# Patient Record
Sex: Male | Born: 1955 | Race: White | Hispanic: No | Marital: Married | Smoking: Former smoker
Health system: Southern US, Community
[De-identification: ages and names within clinical notes are randomized; demographics above are authoritative.]

## PROBLEM LIST (undated history)

## (undated) DIAGNOSIS — D369 Benign neoplasm, unspecified site: Secondary | ICD-10-CM

## (undated) DIAGNOSIS — J189 Pneumonia, unspecified organism: Secondary | ICD-10-CM

## (undated) DIAGNOSIS — T884XXA Failed or difficult intubation, initial encounter: Secondary | ICD-10-CM

## (undated) DIAGNOSIS — J449 Chronic obstructive pulmonary disease, unspecified: Secondary | ICD-10-CM

## (undated) HISTORY — PX: APPENDECTOMY: SHX54

## (undated) HISTORY — PX: VASECTOMY: SHX75

## (undated) HISTORY — DX: Benign neoplasm, unspecified site: D36.9

## (undated) HISTORY — PX: POLYPECTOMY: SHX149

## (undated) HISTORY — PX: TONSILLECTOMY: SUR1361

---

## 2008-11-27 HISTORY — PX: COLONOSCOPY: SHX174

## 2015-01-13 ENCOUNTER — Encounter: Payer: Self-pay | Admitting: Internal Medicine

## 2015-01-13 ENCOUNTER — Telehealth: Payer: Self-pay | Admitting: Internal Medicine

## 2015-03-19 ENCOUNTER — Ambulatory Visit (AMBULATORY_SURGERY_CENTER): Payer: Self-pay | Admitting: *Deleted

## 2015-03-19 VITALS — Ht 68.0 in | Wt 217.0 lb

## 2015-03-19 DIAGNOSIS — Z86010 Personal history of colonic polyps: Secondary | ICD-10-CM

## 2015-03-19 MED ORDER — MOVIPREP 100 G PO SOLR: ORAL | Status: DC

## 2015-03-19 NOTE — Progress Notes (Signed)
Patient denies any allergies to eggs or soy. Patient denies any problems with anesthesia/sedation. Per last colon report, patient was unable to sedate with moderate sedation, patient states he was told he was combative, so propofol used and patient states he did fine with that.  Patient denies any oxygen use at home and does not take any diet/weight loss medications. EMMI education assisgned to patient on colonoscopy, this was explained and instructions given to patient.

## 2015-04-02 ENCOUNTER — Other Ambulatory Visit: Payer: Self-pay | Admitting: Internal Medicine

## 2015-04-02 ENCOUNTER — Ambulatory Visit (AMBULATORY_SURGERY_CENTER): Payer: Managed Care, Other (non HMO) | Admitting: Internal Medicine

## 2015-04-02 ENCOUNTER — Encounter: Payer: Self-pay | Admitting: Internal Medicine

## 2015-04-02 VITALS — BP 113/71 | HR 76 | Temp 97.5°F | Resp 16 | Ht 68.0 in | Wt 217.0 lb

## 2015-04-02 DIAGNOSIS — D122 Benign neoplasm of ascending colon: Secondary | ICD-10-CM

## 2015-04-02 DIAGNOSIS — D123 Benign neoplasm of transverse colon: Secondary | ICD-10-CM | POA: Diagnosis not present

## 2015-04-02 DIAGNOSIS — D124 Benign neoplasm of descending colon: Secondary | ICD-10-CM

## 2015-04-02 DIAGNOSIS — Z86010 Personal history of colonic polyps: Secondary | ICD-10-CM

## 2015-04-02 DIAGNOSIS — D120 Benign neoplasm of cecum: Secondary | ICD-10-CM | POA: Diagnosis not present

## 2015-04-02 DIAGNOSIS — D125 Benign neoplasm of sigmoid colon: Secondary | ICD-10-CM

## 2015-04-02 MED ORDER — SODIUM CHLORIDE 0.9 % IV SOLN: 500.0000 mL | INTRAVENOUS | Status: DC

## 2015-04-02 MED ORDER — LINACLOTIDE 145 MCG PO CAPS: 145.0000 ug | ORAL_CAPSULE | Freq: Every day | ORAL | Status: DC

## 2015-04-02 NOTE — Op Note (Signed)
[facility]  [address] [place], [address]   COLONOSCOPY PROCEDURE REPORT  PATIENT: Tim, West  MR#: [id number] BIRTHDATE: [birth year] , 76  yrs. old GENDER: male ENDOSCOPIST: Jerene Bears, MD REFERRED BY: Kennieth Rad, MD PROCEDURE DATE:  04/02/2015 PROCEDURE:   Colonoscopy, surveillance and Colonoscopy with snare polypectomy First Screening Colonoscopy - Avg.  risk and is 50 yrs.  old or older - No.  Prior Negative Screening - Now for repeat screening. N/A  History of Adenoma - Now for follow-up colonoscopy & has been > or = to 3 yrs.  Yes hx of adenoma.  Has been 3 or more years since last colonoscopy.  Polyps Removed Today ASA CLASS:   Class III INDICATIONS:Surveillance due to prior colonic neoplasia and PH Colon Adenoma (Dr. West Carbo roughly 9 yrs ago). MEDICATIONS: Propofol 550 mg IV and Monitored anesthesia care  DESCRIPTION OF PROCEDURE:   After the risks benefits and alternatives of the procedure were thoroughly explained, informed consent was obtained.  The digital rectal exam revealed no rectal mass and revealed several skin tags.   The [id number] endoscope was introduced through the anus and advanced to the cecum, which was identified by both the appendix and ileocecal valve. No adverse events experienced.   The quality of the prep was (MoviPrep was used) fair.  The instrument was then slowly withdrawn as the colon was fully examined.   COLON FINDINGS: A sessile polyp measuring 6 mm in size was found at the appendiceal orifice.  A polypectomy was performed with a cold snare.  The resection was complete, the polyp tissue was completely retrieved and sent to histology.   Two sessile polyps ranging between 3-67mm in size were found at the cecum.  Polypectomies were performed with cold forceps.  The resection was complete, the polyp tissue was completely retrieved and sent to histology.   A sessile polyp measuring 25 mm in size with a  mucous cap was found in the ascending colon.  A polypectomy was performed using snare cautery. The resection was complete, the polyp tissue was completely retrieved and sent to histology.   Six sessile polyps ranging between 3-49mm in size were found in the ascending colon and transverse colon.  Polypectomies were performed with a cold snare. The resection was complete, the polyp tissue was completely retrieved and sent to histology.   Three sessile polyps ranging between 3-84mm in size were found in the descending colon and sigmoid colon.  Polypectomies were performed with a cold snare. The resection was complete, the polyp tissue was completely retrieved and sent to histology.  Retroflexed views revealed internal hemorrhoids and Retroflexed views revealed external hemorrhoids. The time to cecum = 12.2 Withdrawal time = 28.9   The scope was withdrawn and the procedure completed. COMPLICATIONS: There were no immediate complications.  ENDOSCOPIC IMPRESSION: 1.   Sessile polyp was found at the appendiceal orifice; polypectomy was performed with a cold snare 2.   Two sessile polyps ranging between 3-31mm in size were found at the cecum; polypectomies were performed with cold forceps 3.   Sessile polyp was found in the ascending colon; polypectomy was performed using snare cautery 4.   Six sessile polyps ranging between 3-22mm in size were found in the ascending colon and transverse colon; polypectomies were performed with a cold snare 5.   Three sessile polyps ranging between 3-25mm in size were found in the descending colon and sigmoid colon; polypectomies were performed with a cold snare  RECOMMENDATIONS: 1.  Hold Aspirin and all other NSAIDS for 2 weeks. 2.  Await pathology results 3.  Repeat Colonoscopy in 1 year. 4.  You will receive a letter within 1-2 weeks with the results of your biopsy as well as final recommendations.  Please call my office if you have not received a letter  after 3 weeks.  eSigned:  Jerene Bears, MD 04/02/2015 10:43 AM   cc: The Patient, Kennieth Rad, MD   PATIENT NAME:  Tim, West MR#: [id number]

## 2015-04-02 NOTE — Patient Instructions (Signed)
NO ASPIRIN, ASPIRIN PRODUCTS OR NSAIDS (MOTRIN, ALEVE,IBUPROFEN OR ADVIL) FOR TWO WEEKS (Apr 16, 2015)    YOU HAD AN ENDOSCOPIC PROCEDURE TODAY AT [facility]:   Refer to the procedure report that was given to you for any specific questions about what was found during the examination.  If the procedure report does not answer your questions, please call your gastroenterologist to clarify.  If you requested that your care partner not be given the details of your procedure findings, then the procedure report has been included in a sealed envelope for you to review at your convenience later.  YOU SHOULD EXPECT: Some feelings of bloating in the abdomen. Passage of more gas than usual.  Walking can help get rid of the air that was put into your GI tract during the procedure and reduce the bloating. If you had a lower endoscopy (such as a colonoscopy or flexible sigmoidoscopy) you may notice spotting of blood in your stool or on the toilet paper. If you underwent a bowel prep for your procedure, you may not have a normal bowel movement for a few days.  Please Note:  You might notice some irritation and congestion in your nose or some drainage.  This is from the oxygen used during your procedure.  There is no need for concern and it should clear up in a day or so.  SYMPTOMS TO REPORT IMMEDIATELY:   Following lower endoscopy (colonoscopy or flexible sigmoidoscopy):  Excessive amounts of blood in the stool  Significant tenderness or worsening of abdominal pains  Swelling of the abdomen that is new, acute  Fever of 100F or higher   For urgent or emergent issues, a gastroenterologist can be reached at any hour by calling [phone].   DIET: Your first meal following the procedure should be a small meal and then it is ok to progress to your normal diet. Heavy or fried foods are harder to digest and may make you feel nauseous or bloated.  Likewise, meals heavy in dairy and vegetables  can increase bloating.  Drink plenty of fluids but you should avoid alcoholic beverages for 24 hours.  ACTIVITY:  You should plan to take it easy for the rest of today and you should NOT DRIVE or use heavy machinery until tomorrow (because of the sedation medicines used during the test).    FOLLOW UP: Our staff will call the number listed on your records the next business day following your procedure to check on you and address any questions or concerns that you may have regarding the information given to you following your procedure. If we do not reach you, we will leave a message.  However, if you are feeling well and you are not experiencing any problems, there is no need to return our call.  We will assume that you have returned to your regular daily activities without incident.  If any biopsies were taken you will be contacted by phone or by letter within the next 1-3 weeks.  Please call us at [phone] if you have not heard about the biopsies in 3 weeks.    SIGNATURES/CONFIDENTIALITY: You and/or your care partner have signed paperwork which will be entered into your electronic medical record.  These signatures attest to the fact that that the information above on your After Visit Summary has been reviewed and is understood.  Full responsibility of the confidentiality of this discharge information lies with you and/or your care-partner.

## 2015-04-02 NOTE — Progress Notes (Signed)
A/ox3 pleased with MAC, report to Karen RN 

## 2015-04-02 NOTE — Progress Notes (Signed)
Called to room to assist during endoscopic procedure.  Patient ID and intended procedure confirmed with present staff. Received instructions for my participation in the procedure from the performing physician.  

## 2015-04-05 ENCOUNTER — Telehealth: Payer: Self-pay | Admitting: *Deleted

## 2015-04-05 NOTE — Telephone Encounter (Signed)
  Follow up Call-  Call back number 04/02/2015  Post procedure Call Back phone  # [phone]  Permission to leave phone message Yes     Patient questions:  Do you have a fever, pain , or abdominal swelling? No. Pain Score  0 *  Have you tolerated food without any problems? Yes.    Have you been able to return to your normal activities? Yes.    Do you have any questions about your discharge instructions: Diet   No. Medications  No. Follow up visit  No.  Do you have questions or concerns about your Care? No.  Actions: * If pain score is 4 or above: No action needed, pain <4.

## 2015-04-08 ENCOUNTER — Encounter: Payer: Self-pay | Admitting: Internal Medicine

## 2015-05-07 NOTE — Telephone Encounter (Signed)
Pt seen

## 2016-02-15 ENCOUNTER — Encounter: Payer: Self-pay | Admitting: Internal Medicine

## 2016-05-08 ENCOUNTER — Encounter: Payer: Self-pay | Admitting: Internal Medicine

## 2016-06-02 ENCOUNTER — Encounter: Payer: Managed Care, Other (non HMO) | Admitting: Internal Medicine

## 2016-08-01 ENCOUNTER — Telehealth: Payer: Self-pay

## 2016-08-01 NOTE — Telephone Encounter (Signed)
Dr Hilarie Fredrickson,     FAIR prep with [facility] one year ago.  2 day prep OK?                                                                           Thanks,                                                                              Levada Dy

## 2016-08-01 NOTE — Telephone Encounter (Signed)
Yes, 2 day prep 

## 2016-08-04 ENCOUNTER — Ambulatory Visit (AMBULATORY_SURGERY_CENTER): Payer: Self-pay

## 2016-08-04 ENCOUNTER — Encounter (INDEPENDENT_AMBULATORY_CARE_PROVIDER_SITE_OTHER): Payer: Self-pay

## 2016-08-04 VITALS — Ht 69.0 in | Wt 238.0 lb

## 2016-08-04 DIAGNOSIS — Z860100 Personal history of colon polyps, unspecified: Secondary | ICD-10-CM

## 2016-08-04 MED ORDER — SUPREP BOWEL PREP KIT 17.5-3.13-1.6 GM/177ML PO SOLN: 1.0000 | Freq: Once | ORAL | 0 refills | Status: AC

## 2016-08-04 NOTE — Progress Notes (Signed)
No allergies to eggs or soy No past problems with anesthesia No diet meds No home oxygen  Declined emmi 

## 2016-08-09 ENCOUNTER — Encounter: Payer: Self-pay | Admitting: Internal Medicine

## 2016-08-18 ENCOUNTER — Encounter: Payer: Self-pay | Admitting: Internal Medicine

## 2016-08-18 ENCOUNTER — Ambulatory Visit (AMBULATORY_SURGERY_CENTER): Payer: Managed Care, Other (non HMO) | Admitting: Internal Medicine

## 2016-08-18 VITALS — BP 105/64 | HR 65 | Temp 97.3°F | Resp 12 | Ht 69.0 in | Wt 238.0 lb

## 2016-08-18 DIAGNOSIS — D124 Benign neoplasm of descending colon: Secondary | ICD-10-CM | POA: Diagnosis not present

## 2016-08-18 DIAGNOSIS — D122 Benign neoplasm of ascending colon: Secondary | ICD-10-CM

## 2016-08-18 DIAGNOSIS — D123 Benign neoplasm of transverse colon: Secondary | ICD-10-CM

## 2016-08-18 DIAGNOSIS — D120 Benign neoplasm of cecum: Secondary | ICD-10-CM

## 2016-08-18 DIAGNOSIS — Z86010 Personal history of colonic polyps: Secondary | ICD-10-CM | POA: Diagnosis not present

## 2016-08-18 MED ORDER — SODIUM CHLORIDE 0.9 % IV SOLN: 500.0000 mL | INTRAVENOUS | Status: AC

## 2016-08-18 NOTE — Patient Instructions (Signed)
YOU HAD AN ENDOSCOPIC PROCEDURE TODAY AT [facility]:   Refer to the procedure report that was given to you for any specific questions about what was found during the examination.  If the procedure report does not answer your questions, please call your gastroenterologist to clarify.  If you requested that your care partner not be given the details of your procedure findings, then the procedure report has been included in a sealed envelope for you to review at your convenience later.  YOU SHOULD EXPECT: Some feelings of bloating in the abdomen. Passage of more gas than usual.  Walking can help get rid of the air that was put into your GI tract during the procedure and reduce the bloating. If you had a lower endoscopy (such as a colonoscopy or flexible sigmoidoscopy) you may notice spotting of blood in your stool or on the toilet paper. If you underwent a bowel prep for your procedure, you may not have a normal bowel movement for a few days.  Please Note:  You might notice some irritation and congestion in your nose or some drainage.  This is from the oxygen used during your procedure.  There is no need for concern and it should clear up in a day or so.  SYMPTOMS TO REPORT IMMEDIATELY:   Following lower endoscopy (colonoscopy or flexible sigmoidoscopy):  Excessive amounts of blood in the stool  Significant tenderness or worsening of abdominal pains  Swelling of the abdomen that is new, acute  Fever of 100F or higher   For urgent or emergent issues, a gastroenterologist can be reached at any hour by calling [phone].   DIET:  We do recommend a small meal at first, but then you may proceed to your regular diet.  Drink plenty of fluids but you should avoid alcoholic beverages for 24 hours. Try to increase the fiber in your diet  ACTIVITY:  You should plan to take it easy for the rest of today and you should NOT DRIVE or use heavy machinery until tomorrow (because of the  sedation medicines used during the test).    FOLLOW UP: Our staff will call the number listed on your records the next business day following your procedure to check on you and address any questions or concerns that you may have regarding the information given to you following your procedure. If we do not reach you, we will leave a message.  However, if you are feeling well and you are not experiencing any problems, there is no need to return our call.  We will assume that you have returned to your regular daily activities without incident.  If any biopsies were taken you will be contacted by phone or by letter within the next 1-3 weeks.  Please call us at [phone] if you have not heard about the biopsies in 3 weeks.    SIGNATURES/CONFIDENTIALITY: You and/or your care partner have signed paperwork which will be entered into your electronic medical record.  These signatures attest to the fact that that the information above on your After Visit Summary has been reviewed and is understood.  Full responsibility of the confidentiality of this discharge information lies with you and/or your care-partner.  Read all of the handouts given to you by your recovery room nurse.  Thank-you for choosing [facility] for your healthcare needs today.

## 2016-08-18 NOTE — Progress Notes (Signed)
Report to PACU, RN, vss, BBS= Clear.  

## 2016-08-18 NOTE — Progress Notes (Signed)
Called to room to assist during endoscopic procedure.  Patient ID and intended procedure confirmed with present staff. Received instructions for my participation in the procedure from the performing physician.  

## 2016-08-18 NOTE — Op Note (Signed)
[facility] Patient Name: Tim West Procedure Date: 08/18/2016 8:19 AM MRN: [id number] Endoscopist: Jerene Bears , MD Age: 60 Referring MD:  Date of Birth: [birth year] Gender: Male Account #: [id number] Procedure:                Colonoscopy Indications:              High risk colon cancer surveillance: Personal                            history of multiple (3 or more) adenomas, High risk                            colon cancer surveillance: Personal history of                            sessile serrated colon polyp (10 mm or greater in                            size), Last colonoscopy 1 year ago Medicines:                Monitored Anesthesia Care Procedure:                Pre-Anesthesia Assessment:                           - Prior to the procedure, a History and Physical                            was performed, and patient medications and                            allergies were reviewed. The patient's tolerance of                            previous anesthesia was also reviewed. The risks                            and benefits of the procedure and the sedation                            options and risks were discussed with the patient.                            All questions were answered, and informed consent                            was obtained. Prior Anticoagulants: The patient has                            taken no previous anticoagulant or antiplatelet                            agents. ASA Grade Assessment: II - A patient with  mild systemic disease. After reviewing the risks                            and benefits, the patient was deemed in                            satisfactory condition to undergo the procedure.                           After obtaining informed consent, the colonoscope                            was passed under direct vision. Throughout the                            procedure, the patient's blood  pressure, pulse, and                            oxygen saturations were monitored continuously. The                            Model CF-[id number]) scope was introduced                            through the anus and advanced to the the cecum,                            identified by appendiceal orifice and ileocecal                            valve. The colonoscopy was performed without                            difficulty. The patient tolerated the procedure                            well. The quality of the bowel preparation was                            good. The ileocecal valve, appendiceal orifice, and                            rectum were photographed. Scope In: 8:27:11 AM Scope Out: 9:01:02 AM Scope Withdrawal Time: 0 hours 25 minutes 35 seconds  Total Procedure Duration: 0 hours 33 minutes 51 seconds  Findings:                 The perianal exam findings include skin tags.                           A 2 mm polyp was found in the cecum. The polyp was                            sessile. The polyp was removed with a cold  biopsy                            forceps. Resection and retrieval were complete.                           A 5 mm polyp was found in the ascending colon. The                            polyp was sessile. The polyp was removed with a                            cold snare. Resection and retrieval were complete.                           A 5 mm polyp was found in the transverse colon. The                            polyp was sessile. The polyp was removed with a                            cold snare. Resection and retrieval were complete.                           Two sessile polyps were found in the descending                            colon. The polyps were 4 to 5 mm in size. These                            polyps were removed with a cold snare. Resection                            and retrieval were complete.                           External and  internal hemorrhoids were found during                            retroflexion. Complications:            No immediate complications. Estimated Blood Loss:     Estimated blood loss was minimal. Impression:               - One 2 mm polyp in the cecum, removed with a cold                            biopsy forceps. Resected and retrieved.                           - One 5 mm polyp in the ascending colon, removed                            with a cold  snare. Resected and retrieved.                           - One 5 mm polyp in the transverse colon, removed                            with a cold snare. Resected and retrieved.                           - Two 4 to 5 mm polyps in the descending colon,                            removed with a cold snare. Resected and retrieved.                           - External and internal hemorrhoids. Recommendation:           - Patient has a contact number available for                            emergencies. The signs and symptoms of potential                            delayed complications were discussed with the                            patient. Return to normal activities tomorrow.                            Written discharge instructions were provided to the                            patient.                           - Resume previous diet.                           - Continue present medications.                           - Await pathology results.                           - Repeat colonoscopy is recommended for                            surveillance of multiple polyps. The colonoscopy                            date will be determined after pathology results                            from today's exam become available for review. Jerene Bears, MD 08/18/2016 9:06:30 AM This report has been signed electronically.

## 2016-08-21 ENCOUNTER — Telehealth: Payer: Self-pay

## 2016-08-21 NOTE — Telephone Encounter (Signed)
  Follow up Call-  Call back number 08/18/2016 04/02/2015  Post procedure Call Back phone  # [phone]  Permission to leave phone message Yes Yes     Patient questions:  Do you have a fever, pain , or abdominal swelling? No. Pain Score  0 *  Have you tolerated food without any problems? Yes.    Have you been able to return to your normal activities? Yes.    Do you have any questions about your discharge instructions: Diet   No. Medications  No. Follow up visit  No.  Do you have questions or concerns about your Care? No.  Actions: * If pain score is 4 or above: No action needed, pain <4.

## 2016-08-22 ENCOUNTER — Encounter: Payer: Self-pay | Admitting: Internal Medicine

## 2016-08-24 ENCOUNTER — Encounter: Payer: Self-pay | Admitting: Internal Medicine

## 2018-07-04 ENCOUNTER — Encounter: Payer: Self-pay | Admitting: Internal Medicine

## 2018-09-13 ENCOUNTER — Ambulatory Visit (AMBULATORY_SURGERY_CENTER): Payer: Self-pay | Admitting: *Deleted

## 2018-09-13 ENCOUNTER — Encounter: Payer: Self-pay | Admitting: Internal Medicine

## 2018-09-13 VITALS — Ht 69.0 in | Wt 239.0 lb

## 2018-09-13 DIAGNOSIS — Z86010 Personal history of colonic polyps: Secondary | ICD-10-CM

## 2018-09-13 MED ORDER — NA SULFATE-K SULFATE-MG SULF 17.5-3.13-1.6 GM/177ML PO SOLN: 1.0000 | Freq: Once | ORAL | 0 refills | Status: AC

## 2018-09-13 NOTE — Progress Notes (Signed)
No egg or soy allergy known to patient  No issues with past sedation with any surgeries  or procedures, no intubation problems  No diet pills per patient No home 02 use per patient  No blood thinners per patient  Pt  issues with constipation - uses prn dulcolax tabl;ets- 2 day prep  No A fib or A flutter  EMMI video sent to pt's e mail - pt declined suprep coupon $15 to pt in pv

## 2018-09-27 ENCOUNTER — Ambulatory Visit (AMBULATORY_SURGERY_CENTER): Payer: Managed Care, Other (non HMO) | Admitting: Internal Medicine

## 2018-09-27 ENCOUNTER — Encounter: Payer: Self-pay | Admitting: Internal Medicine

## 2018-09-27 VITALS — BP 106/70 | HR 67 | Temp 96.8°F | Resp 15

## 2018-09-27 DIAGNOSIS — D123 Benign neoplasm of transverse colon: Secondary | ICD-10-CM

## 2018-09-27 DIAGNOSIS — Z86010 Personal history of colonic polyps: Secondary | ICD-10-CM

## 2018-09-27 MED ORDER — SODIUM CHLORIDE 0.9 % IV SOLN: 500.0000 mL | Freq: Once | INTRAVENOUS | Status: AC

## 2018-09-27 MED ORDER — LINACLOTIDE 145 MCG PO CAPS: 145.0000 ug | ORAL_CAPSULE | Freq: Every day | ORAL | 3 refills | Status: DC

## 2018-09-27 NOTE — Progress Notes (Signed)
To recovery, report to RN, VSS. 

## 2018-09-27 NOTE — Op Note (Signed)
[facility] Patient Name: Tim West Procedure Date: 09/27/2018 2:02 PM MRN: [id number] Endoscopist: Jerene Bears , MD Age: 62 Referring MD:  Date of Birth: September 29, 1956 Gender: Male Account #: [id number] Procedure:                Colonoscopy Indications:              High risk colon cancer surveillance: Personal                            history of colonic polyps (adenoma and SSP), Last                            colonoscopy: 2017 (2 years ago) Medicines:                Monitored Anesthesia Care Procedure:                Pre-Anesthesia Assessment:                           - Prior to the procedure, a History and Physical                            was performed, and patient medications and                            allergies were reviewed. The patient's tolerance of                            previous anesthesia was also reviewed. The risks                            and benefits of the procedure and the sedation                            options and risks were discussed with the patient.                            All questions were answered, and informed consent                            was obtained. Prior Anticoagulants: The patient has                            taken no previous anticoagulant or antiplatelet                            agents. ASA Grade Assessment: II - A patient with                            mild systemic disease. After reviewing the risks                            and benefits, the patient was deemed in  satisfactory condition to undergo the procedure.                           After obtaining informed consent, the colonoscope                            was passed under direct vision. Throughout the                            procedure, the patient's blood pressure, pulse, and                            oxygen saturations were monitored continuously. The                            Colonoscope was introduced through  the anus and                            advanced to the cecum, identified by appendiceal                            orifice and ileocecal valve. The colonoscopy was                            technically difficult and complex due to a                            redundant colon and significant looping. Successful                            completion of the procedure was aided by using                            manual pressure. The patient tolerated the                            procedure well. The quality of the bowel                            preparation was good. The ileocecal valve,                            appendiceal orifice, and rectum were photographed. Scope In: 2:11:40 PM Scope Out: 2:37:32 PM Scope Withdrawal Time: 0 hours 18 minutes 26 seconds  Total Procedure Duration: 0 hours 25 minutes 52 seconds  Findings:                 The digital rectal exam was normal.                           Four sessile polyps were found in the transverse                            colon. The polyps were 4 to 6 mm in size. These  polyps were removed with a cold snare. Resection                            and retrieval were complete.                           Internal hemorrhoids and hypertrophied anal                            papillae were found during retroflexion. The                            hemorrhoids were small. Complications:            No immediate complications. Estimated Blood Loss:     Estimated blood loss was minimal. Impression:               - Four 4 to 6 mm polyps in the transverse colon,                            removed with a cold snare. Resected and retrieved.                           - Small internal hemorrhoids. Recommendation:           - Patient has a contact number available for                            emergencies. The signs and symptoms of potential                            delayed complications were discussed with the                             patient. Return to normal activities tomorrow.                            Written discharge instructions were provided to the                            patient.                           - Resume previous diet.                           - Continue present medications.                           - Await pathology results.                           - Repeat colonoscopy is recommended for                            surveillance. The colonoscopy date will be  determined after pathology results from today's                            exam become available for review. Jerene Bears, MD 09/27/2018 2:42:36 PM This report has been signed electronically.

## 2018-09-27 NOTE — Progress Notes (Signed)
Called to room to assist during endoscopic procedure.  Patient ID and intended procedure confirmed with present staff. Received instructions for my participation in the procedure from the performing physician.  

## 2018-09-27 NOTE — Patient Instructions (Signed)
YOU HAD AN ENDOSCOPIC PROCEDURE TODAY AT THE [facility]:   Refer to the procedure report that was given to you for any specific questions about what was found during the examination.  If the procedure report does not answer your questions, please call your gastroenterologist to clarify.  If you requested that your care partner not be given the details of your procedure findings, then the procedure report has been included in a sealed envelope for you to review at your convenience later.  YOU SHOULD EXPECT: Some feelings of bloating in the abdomen. Passage of more gas than usual.  Walking can help get rid of the air that was put into your GI tract during the procedure and reduce the bloating. If you had a lower endoscopy (such as a colonoscopy or flexible sigmoidoscopy) you may notice spotting of blood in your stool or on the toilet paper. If you underwent a bowel prep for your procedure, you may not have a normal bowel movement for a few days.  Please Note:  You might notice some irritation and congestion in your nose or some drainage.  This is from the oxygen used during your procedure.  There is no need for concern and it should clear up in a day or so.  SYMPTOMS TO REPORT IMMEDIATELY:   Following lower endoscopy (colonoscopy or flexible sigmoidoscopy):  Excessive amounts of blood in the stool  Significant tenderness or worsening of abdominal pains  Swelling of the abdomen that is new, acute  Fever of 100F or higher  For urgent or emergent issues, a gastroenterologist can be reached at any hour by calling [phone].   DIET:  We do recommend a small meal at first, but then you may proceed to your regular diet.  Drink plenty of fluids but you should avoid alcoholic beverages for 24 hours.  MEDICATIONS: Continue present medications.  Please see handouts given to you by your recovery nurse.  ACTIVITY:  You should plan to take it easy for the rest of today and you should NOT  DRIVE or use heavy machinery until tomorrow (because of the sedation medicines used during the test).    FOLLOW UP: Our staff will call the number listed on your records the next business day following your procedure to check on you and address any questions or concerns that you may have regarding the information given to you following your procedure. If we do not reach you, we will leave a message.  However, if you are feeling well and you are not experiencing any problems, there is no need to return our call.  We will assume that you have returned to your regular daily activities without incident.  If any biopsies were taken you will be contacted by phone or by letter within the next 1-3 weeks.  Please call us at [phone] if you have not heard about the biopsies in 3 weeks.   Thank you for allowing us to provide for your healthcare needs today.   SIGNATURES/CONFIDENTIALITY: You and/or your care partner have signed paperwork which will be entered into your electronic medical record.  These signatures attest to the fact that that the information above on your After Visit Summary has been reviewed and is understood.  Full responsibility of the confidentiality of this discharge information lies with you and/or your care-partner. 

## 2018-09-27 NOTE — Progress Notes (Signed)
Pt's states no medical or surgical changes since previsit or office visit. 

## 2018-09-30 ENCOUNTER — Telehealth: Payer: Self-pay

## 2018-09-30 NOTE — Telephone Encounter (Signed)
  Follow up Call-  Call back number 09/27/2018 08/18/2016  Post procedure Call Back phone  # [phone]  Permission to leave phone message Yes Yes  Some recent data might be hidden     Patient questions:  Do you have a fever, pain , or abdominal swelling? No. Pain Score  0 *  Have you tolerated food without any problems? Yes.    Have you been able to return to your normal activities? Yes.    Do you have any questions about your discharge instructions: Diet   No. Medications  No. Follow up visit  No.  Do you have questions or concerns about your Care? No.  Actions: * If pain score is 4 or above: No action needed, pain <4.

## 2018-10-04 ENCOUNTER — Encounter: Payer: Self-pay | Admitting: Internal Medicine

## 2018-11-27 DIAGNOSIS — T884XXA Failed or difficult intubation, initial encounter: Secondary | ICD-10-CM

## 2018-11-27 HISTORY — DX: Failed or difficult intubation, initial encounter: T88.4XXA

## 2019-06-16 ENCOUNTER — Ambulatory Visit: Payer: Self-pay | Admitting: Orthopedic Surgery

## 2019-06-27 NOTE — Pre-Procedure Instructions (Signed)
Ridgely Anastacio  06/27/2019      [id number] - [place] [place] [address] Phone: [phone] Fax: [phone]    Your procedure is scheduled on July 03, 2019.  Report to [facility] Admitting at 945 AM.  Call this number if you have problems the morning of surgery:  [phone]   Remember:  Do not eat or drink after midnight.    Take these medicines the morning of surgery with A SIP OF WATER  Linzess-if needed  7 days prior to surgery STOP taking any Aspirin (unless otherwise instructed by your surgeon), Aleve, Naproxen, Ibuprofen, Motrin, Advil, Goody's, BC's, all herbal medications, fish oil, and all vitamins    Do not wear jewelry  Do not wear lotions, powders, or colognes, or deodorant.  Men may shave face and neck.  Do not bring valuables to the hospital.  [facility] is not responsible for any belongings or valuables.  IF you are a smoker, DO NOT Smoke 24 hours prior to surgery   IF you wear a CPAP at night please bring your mask, tubing, and machine the morning of surgery    Remember that you must have someone to transport you home after your surgery, and remain with you for 24 hours if you are discharged the same day.  Contacts, dentures or bridgework may not be worn into surgery.  Leave your suitcase in the car.  After surgery it may be brought to your room.  For patients admitted to the hospital, discharge time will be determined by your treatment team.  Patients discharged the day of surgery will not be allowed to drive home.    [facility]- Preparing For Surgery  Before surgery, you can play an important role. Because skin is not sterile, your skin needs to be as free of germs as possible. You can reduce the number of germs on your skin by washing with CHG (chlorahexidine gluconate) Soap before surgery.  CHG is an antiseptic cleaner which kills germs and bonds  with the skin to continue killing germs even after washing.    Oral Hygiene is also important to reduce your risk of infection.  Remember - BRUSH YOUR TEETH THE MORNING OF SURGERY WITH YOUR REGULAR TOOTHPASTE  Please do not use if you have an allergy to CHG or antibacterial soaps. If your skin becomes reddened/irritated stop using the CHG.  Do not shave (including legs and underarms) for at least 48 hours prior to first CHG shower. It is OK to shave your face.  Please follow these instructions carefully.   1. Shower the NIGHT BEFORE SURGERY and the MORNING OF SURGERY with CHG.   2. If you chose to wash your hair, wash your hair first as usual with your normal shampoo.  3. After you shampoo, rinse your hair and body thoroughly to remove the shampoo.  4. Use CHG as you would any other liquid soap. You can apply CHG directly to the skin and wash gently with a scrungie or a clean washcloth.   5. Apply the CHG Soap to your body ONLY FROM THE NECK DOWN.  Do not use on open wounds or open sores. Avoid contact with your eyes, ears, mouth and genitals (private parts). Wash Face and genitals (private parts)  with your normal soap.  6. Wash thoroughly, paying special attention to the area where your surgery will be performed.  7. Thoroughly  rinse your body with warm water from the neck down.  8. DO NOT shower/wash with your normal soap after using and rinsing off the CHG Soap.  9. Pat yourself dry with a CLEAN TOWEL.  10. Wear CLEAN PAJAMAS to bed the night before surgery, wear comfortable clothes the morning of surgery  11. Place CLEAN SHEETS on your bed the night of your first shower and DO NOT SLEEP WITH PETS.  Day of Surgery: Shower as above Do not apply any deodorants/lotions.  Please wear clean clothes to the hospital/surgery center.   Remember to brush your teeth WITH YOUR REGULAR TOOTHPASTE.  Please read over the following fact sheets that you were given.

## 2019-06-30 ENCOUNTER — Other Ambulatory Visit (HOSPITAL_COMMUNITY)
Admission: RE | Admit: 2019-06-30 | Discharge: 2019-06-30 | Disposition: A | Discharge status: Home / Self Care | Payer: Managed Care, Other (non HMO) | Source: Ambulatory Visit | Attending: Orthopedic Surgery | Admitting: Orthopedic Surgery

## 2019-06-30 ENCOUNTER — Ambulatory Visit (HOSPITAL_COMMUNITY)
Admission: RE | Admit: 2019-06-30 | Discharge: 2019-06-30 | Disposition: A | Discharge status: Home / Self Care | Payer: Managed Care, Other (non HMO) | Source: Ambulatory Visit | Attending: Orthopedic Surgery | Admitting: Orthopedic Surgery

## 2019-06-30 ENCOUNTER — Encounter (HOSPITAL_COMMUNITY): Payer: Self-pay

## 2019-06-30 ENCOUNTER — Encounter (HOSPITAL_COMMUNITY)
Admission: RE | Admit: 2019-06-30 | Discharge: 2019-06-30 | Disposition: A | Discharge status: Home / Self Care | Payer: Managed Care, Other (non HMO) | Source: Ambulatory Visit | Attending: Orthopedic Surgery | Admitting: Orthopedic Surgery

## 2019-06-30 ENCOUNTER — Other Ambulatory Visit: Payer: Self-pay

## 2019-06-30 DIAGNOSIS — Z01818 Encounter for other preprocedural examination: Secondary | ICD-10-CM | POA: Insufficient documentation

## 2019-06-30 DIAGNOSIS — Z20828 Contact with and (suspected) exposure to other viral communicable diseases: Secondary | ICD-10-CM | POA: Insufficient documentation

## 2019-06-30 HISTORY — DX: Pneumonia, unspecified organism: J18.9

## 2019-06-30 LAB — URINALYSIS, ROUTINE W REFLEX MICROSCOPIC
Bilirubin Urine: NEGATIVE
Glucose, UA: NEGATIVE mg/dL
Hgb urine dipstick: NEGATIVE
Ketones, ur: NEGATIVE mg/dL
Leukocytes,Ua: NEGATIVE
Nitrite: NEGATIVE
Protein, ur: NEGATIVE mg/dL
Specific Gravity, Urine: 1.025 (ref 1.005–1.030)
pH: 5 (ref 5.0–8.0)

## 2019-06-30 LAB — PROTIME-INR
INR: 1 (ref 0.8–1.2)
Prothrombin Time: 13.2 seconds (ref 11.4–15.2)

## 2019-06-30 LAB — SURGICAL PCR SCREEN
MRSA, PCR: NEGATIVE
Staphylococcus aureus: NEGATIVE

## 2019-06-30 LAB — CBC
HCT: 47 % (ref 39.0–52.0)
Hemoglobin: 15.3 g/dL (ref 13.0–17.0)
MCH: 29.9 pg (ref 26.0–34.0)
MCHC: 32.6 g/dL (ref 30.0–36.0)
MCV: 91.8 fL (ref 80.0–100.0)
Platelets: 202 10*3/uL (ref 150–400)
RBC: 5.12 MIL/uL (ref 4.22–5.81)
RDW: 13.2 % (ref 11.5–15.5)
WBC: 6.4 10*3/uL (ref 4.0–10.5)
nRBC: 0 % (ref 0.0–0.2)

## 2019-06-30 LAB — BASIC METABOLIC PANEL
Anion gap: 8 (ref 5–15)
BUN: 19 mg/dL (ref 8–23)
CO2: 23 mmol/L (ref 22–32)
Calcium: 9.2 mg/dL (ref 8.9–10.3)
Chloride: 108 mmol/L (ref 98–111)
Creatinine, Ser: 1 mg/dL (ref 0.61–1.24)
GFR calc Af Amer: >60 mL/min (ref >60)
GFR calc non Af Amer: >60 mL/min (ref >60)
Glucose, Bld: 106 mg/dL — ABNORMAL HIGH (ref 70–99)
Potassium: 4.3 mmol/L (ref 3.5–5.1)
Sodium: 139 mmol/L (ref 135–145)

## 2019-06-30 LAB — APTT: aPTT: 28 seconds (ref 24–36)

## 2019-06-30 LAB — SARS CORONAVIRUS 2 (TAT 6-24 HRS): SARS Coronavirus 2: NEGATIVE

## 2019-06-30 NOTE — Progress Notes (Signed)
PCP - Dr. Kennieth Rad Cardiologist - denies  Chest x-ray - 06-30-19  Anesthesia review: n/a  Patient denies shortness of breath, fever, cough and chest pain at PAT appointment   Patient verbalized understanding of instructions that were given to them at the PAT appointment. Patient was also instructed that they will need to review over the PAT instructions again at home before surgery.

## 2019-07-03 ENCOUNTER — Ambulatory Visit (HOSPITAL_COMMUNITY): Payer: Managed Care, Other (non HMO) | Admitting: Physician Assistant

## 2019-07-03 ENCOUNTER — Ambulatory Visit (HOSPITAL_COMMUNITY): Payer: Managed Care, Other (non HMO)

## 2019-07-03 ENCOUNTER — Other Ambulatory Visit: Payer: Self-pay

## 2019-07-03 ENCOUNTER — Encounter (HOSPITAL_COMMUNITY)
Admission: RE | Disposition: A | Discharge status: Home / Self Care | Payer: Self-pay | Source: Home / Self Care | Attending: Orthopedic Surgery

## 2019-07-03 ENCOUNTER — Encounter (HOSPITAL_COMMUNITY): Payer: Self-pay

## 2019-07-03 ENCOUNTER — Ambulatory Visit (HOSPITAL_COMMUNITY)
Admission: RE | Admit: 2019-07-03 | Discharge: 2019-07-04 | Disposition: A | Discharge status: Home / Self Care | Payer: Managed Care, Other (non HMO) | Attending: Orthopedic Surgery | Admitting: Orthopedic Surgery

## 2019-07-03 ENCOUNTER — Ambulatory Visit (HOSPITAL_COMMUNITY): Payer: Managed Care, Other (non HMO) | Admitting: Anesthesiology

## 2019-07-03 DIAGNOSIS — M5116 Intervertebral disc disorders with radiculopathy, lumbar region: Secondary | ICD-10-CM | POA: Insufficient documentation

## 2019-07-03 DIAGNOSIS — Z419 Encounter for procedure for purposes other than remedying health state, unspecified: Secondary | ICD-10-CM

## 2019-07-03 DIAGNOSIS — F17200 Nicotine dependence, unspecified, uncomplicated: Secondary | ICD-10-CM | POA: Insufficient documentation

## 2019-07-03 DIAGNOSIS — Z79899 Other long term (current) drug therapy: Secondary | ICD-10-CM | POA: Diagnosis not present

## 2019-07-03 DIAGNOSIS — M5126 Other intervertebral disc displacement, lumbar region: Secondary | ICD-10-CM | POA: Diagnosis present

## 2019-07-03 HISTORY — PX: LUMBAR LAMINECTOMY/DECOMPRESSION MICRODISCECTOMY: SHX5026

## 2019-07-03 SURGERY — LUMBAR LAMINECTOMY/DECOMPRESSION MICRODISCECTOMY 1 LEVEL
Anesthesia: General | Site: Back | Laterality: Left

## 2019-07-03 MED ORDER — DEXAMETHASONE SODIUM PHOSPHATE 10 MG/ML IJ SOLN
INTRAMUSCULAR | Status: DC | PRN
  Administered 2019-07-03: 10 mg via INTRAVENOUS

## 2019-07-03 MED ORDER — ONDANSETRON HCL 4 MG/2ML IJ SOLN: 4.0000 mg | Freq: Four times a day (QID) | INTRAMUSCULAR | Status: DC | PRN

## 2019-07-03 MED ORDER — EPHEDRINE SULFATE 50 MG/ML IJ SOLN
INTRAMUSCULAR | Status: DC | PRN
  Administered 2019-07-03: 5 mg via INTRAVENOUS
  Administered 2019-07-03: 7.5 mg via INTRAVENOUS

## 2019-07-03 MED ORDER — OXYCODONE HCL 5 MG/5ML PO SOLN: 5.0000 mg | Freq: Once | ORAL | Status: DC | PRN

## 2019-07-03 MED ORDER — HEMOSTATIC AGENTS (NO CHARGE) OPTIME
TOPICAL | Status: DC | PRN
  Administered 2019-07-03: 1 via TOPICAL

## 2019-07-03 MED ORDER — PHENYLEPHRINE HCL (PRESSORS) 10 MG/ML IV SOLN
INTRAVENOUS | Status: DC | PRN
  Administered 2019-07-03: 60 ug via INTRAVENOUS
  Administered 2019-07-03 (×2): 80 ug via INTRAVENOUS

## 2019-07-03 MED ORDER — ONDANSETRON HCL 4 MG/2ML IJ SOLN
INTRAMUSCULAR | Status: DC | PRN
  Administered 2019-07-03: 4 mg via INTRAVENOUS

## 2019-07-03 MED ORDER — ONDANSETRON HCL 4 MG/2ML IJ SOLN
INTRAMUSCULAR | Status: AC
  Filled 2019-07-03: qty 2

## 2019-07-03 MED ORDER — THROMBIN 20000 UNITS EX SOLR
CUTANEOUS | Status: DC | PRN
  Administered 2019-07-03: 20 mL via TOPICAL

## 2019-07-03 MED ORDER — PROMETHAZINE HCL 25 MG/ML IJ SOLN: 6.2500 mg | INTRAMUSCULAR | Status: DC | PRN

## 2019-07-03 MED ORDER — DEXAMETHASONE SODIUM PHOSPHATE 10 MG/ML IJ SOLN
INTRAMUSCULAR | Status: AC
  Filled 2019-07-03: qty 1

## 2019-07-03 MED ORDER — 0.9 % SODIUM CHLORIDE (POUR BTL) OPTIME
TOPICAL | Status: DC | PRN
  Administered 2019-07-03: 1000 mL

## 2019-07-03 MED ORDER — ROCURONIUM BROMIDE 10 MG/ML (PF) SYRINGE
PREFILLED_SYRINGE | INTRAVENOUS | Status: AC
  Filled 2019-07-03: qty 10

## 2019-07-03 MED ORDER — PHENYLEPHRINE 40 MCG/ML (10ML) SYRINGE FOR IV PUSH (FOR BLOOD PRESSURE SUPPORT)
PREFILLED_SYRINGE | INTRAVENOUS | Status: AC
  Filled 2019-07-03: qty 10

## 2019-07-03 MED ORDER — EPHEDRINE 5 MG/ML INJ
INTRAVENOUS | Status: AC
  Filled 2019-07-03: qty 10

## 2019-07-03 MED ORDER — HYDROMORPHONE HCL 1 MG/ML IJ SOLN: 0.2500 mg | INTRAMUSCULAR | Status: DC | PRN

## 2019-07-03 MED ORDER — METHYLPREDNISOLONE ACETATE 80 MG/ML IJ SUSP
INTRAMUSCULAR | Status: DC | PRN
  Administered 2019-07-03: 40 mg

## 2019-07-03 MED ORDER — PHENOL 1.4 % MT LIQD: 1.0000 | OROMUCOSAL | Status: DC | PRN

## 2019-07-03 MED ORDER — METHYLPREDNISOLONE ACETATE 80 MG/ML IJ SUSP
INTRAMUSCULAR | Status: AC
  Filled 2019-07-03: qty 1

## 2019-07-03 MED ORDER — OXYCODONE-ACETAMINOPHEN 10-325 MG PO TABS: 1.0000 | ORAL_TABLET | Freq: Four times a day (QID) | ORAL | 0 refills | Status: AC | PRN

## 2019-07-03 MED ORDER — ROCURONIUM BROMIDE 10 MG/ML (PF) SYRINGE
PREFILLED_SYRINGE | INTRAVENOUS | Status: DC | PRN
  Administered 2019-07-03: 20 mg via INTRAVENOUS
  Administered 2019-07-03: 60 mg via INTRAVENOUS

## 2019-07-03 MED ORDER — METHOCARBAMOL 500 MG PO TABS
500.0000 mg | ORAL_TABLET | Freq: Four times a day (QID) | ORAL | Status: DC | PRN
  Administered 2019-07-03 – 2019-07-04 (×3): 500 mg via ORAL
  Filled 2019-07-03 (×3): qty 1

## 2019-07-03 MED ORDER — LACTATED RINGERS IV SOLN
INTRAVENOUS | Status: DC
  Administered 2019-07-03: 16:00:00 via INTRAVENOUS

## 2019-07-03 MED ORDER — MORPHINE SULFATE (PF) 2 MG/ML IV SOLN: 2.0000 mg | INTRAVENOUS | Status: DC | PRN

## 2019-07-03 MED ORDER — TRANEXAMIC ACID 1000 MG/10ML IV SOLN
INTRAVENOUS | Status: DC | PRN
  Administered 2019-07-03: 1000 mg via INTRAVENOUS

## 2019-07-03 MED ORDER — MENTHOL 3 MG MT LOZG: 1.0000 | LOZENGE | OROMUCOSAL | Status: DC | PRN

## 2019-07-03 MED ORDER — METHOCARBAMOL 500 MG PO TABS: 500.0000 mg | ORAL_TABLET | Freq: Three times a day (TID) | ORAL | 0 refills | Status: AC | PRN

## 2019-07-03 MED ORDER — ACETAMINOPHEN 325 MG PO TABS: 650.0000 mg | ORAL_TABLET | ORAL | Status: DC | PRN

## 2019-07-03 MED ORDER — ONDANSETRON HCL 4 MG PO TABS: 4.0000 mg | ORAL_TABLET | Freq: Three times a day (TID) | ORAL | 0 refills | Status: DC | PRN

## 2019-07-03 MED ORDER — OXYCODONE HCL 5 MG PO TABS
5.0000 mg | ORAL_TABLET | ORAL | Status: DC | PRN
  Administered 2019-07-03: 5 mg via ORAL
  Filled 2019-07-03: qty 1

## 2019-07-03 MED ORDER — CEFAZOLIN SODIUM-DEXTROSE 1-4 GM/50ML-% IV SOLN
1.0000 g | Freq: Three times a day (TID) | INTRAVENOUS | Status: AC
  Administered 2019-07-03 – 2019-07-04 (×2): 1 g via INTRAVENOUS
  Filled 2019-07-03 (×2): qty 50

## 2019-07-03 MED ORDER — ACETAMINOPHEN 10 MG/ML IV SOLN: 1000.0000 mg | Freq: Once | INTRAVENOUS | Status: DC | PRN

## 2019-07-03 MED ORDER — OXYCODONE HCL 5 MG PO TABS
10.0000 mg | ORAL_TABLET | ORAL | Status: DC | PRN
  Administered 2019-07-03 – 2019-07-04 (×3): 10 mg via ORAL
  Filled 2019-07-03 (×3): qty 2

## 2019-07-03 MED ORDER — MIDAZOLAM HCL 5 MG/5ML IJ SOLN
INTRAMUSCULAR | Status: DC | PRN
  Administered 2019-07-03: 2 mg via INTRAVENOUS

## 2019-07-03 MED ORDER — ACETAMINOPHEN 650 MG RE SUPP: 650.0000 mg | RECTAL | Status: DC | PRN

## 2019-07-03 MED ORDER — CEFAZOLIN SODIUM-DEXTROSE 2-4 GM/100ML-% IV SOLN
2.0000 g | INTRAVENOUS | Status: AC
  Administered 2019-07-03: 2 g via INTRAVENOUS

## 2019-07-03 MED ORDER — MAGNESIUM CITRATE PO SOLN: 1.0000 | Freq: Once | ORAL | Status: DC | PRN

## 2019-07-03 MED ORDER — THROMBIN 20000 UNITS EX SOLR
CUTANEOUS | Status: AC
  Filled 2019-07-03: qty 20000

## 2019-07-03 MED ORDER — SODIUM CHLORIDE 0.9% FLUSH
3.0000 mL | Freq: Two times a day (BID) | INTRAVENOUS | Status: DC
  Administered 2019-07-03: 16:00:00 3 mL via INTRAVENOUS

## 2019-07-03 MED ORDER — CEFAZOLIN SODIUM-DEXTROSE 2-4 GM/100ML-% IV SOLN
INTRAVENOUS | Status: AC
  Filled 2019-07-03: qty 100

## 2019-07-03 MED ORDER — LIDOCAINE 2% (20 MG/ML) 5 ML SYRINGE
INTRAMUSCULAR | Status: AC
  Filled 2019-07-03: qty 5

## 2019-07-03 MED ORDER — PROPOFOL 10 MG/ML IV BOLUS
INTRAVENOUS | Status: AC
  Filled 2019-07-03: qty 20

## 2019-07-03 MED ORDER — BUPIVACAINE-EPINEPHRINE (PF) 0.25% -1:200000 IJ SOLN
INTRAMUSCULAR | Status: AC
  Filled 2019-07-03: qty 30

## 2019-07-03 MED ORDER — LIDOCAINE 2% (20 MG/ML) 5 ML SYRINGE
INTRAMUSCULAR | Status: DC | PRN
  Administered 2019-07-03: 60 mg via INTRAVENOUS
  Administered 2019-07-03: 40 mg via INTRAVENOUS

## 2019-07-03 MED ORDER — LACTATED RINGERS IV SOLN
INTRAVENOUS | Status: DC
  Administered 2019-07-03 (×2): via INTRAVENOUS

## 2019-07-03 MED ORDER — SUFENTANIL CITRATE 50 MCG/ML IV SOLN
INTRAVENOUS | Status: DC | PRN
  Administered 2019-07-03: 20 ug via INTRAVENOUS
  Administered 2019-07-03: 5 ug via INTRAVENOUS

## 2019-07-03 MED ORDER — ONDANSETRON HCL 4 MG PO TABS: 4.0000 mg | ORAL_TABLET | Freq: Four times a day (QID) | ORAL | Status: DC | PRN

## 2019-07-03 MED ORDER — MIDAZOLAM HCL 2 MG/2ML IJ SOLN
INTRAMUSCULAR | Status: AC
  Filled 2019-07-03: qty 2

## 2019-07-03 MED ORDER — POLYETHYLENE GLYCOL 3350 17 G PO PACK: 17.0000 g | PACK | Freq: Every day | ORAL | Status: DC | PRN

## 2019-07-03 MED ORDER — METHOCARBAMOL 1000 MG/10ML IJ SOLN
500.0000 mg | Freq: Four times a day (QID) | INTRAVENOUS | Status: DC | PRN
  Filled 2019-07-03: qty 5

## 2019-07-03 MED ORDER — OXYCODONE HCL 5 MG PO TABS: 5.0000 mg | ORAL_TABLET | Freq: Once | ORAL | Status: DC | PRN

## 2019-07-03 MED ORDER — SUGAMMADEX SODIUM 200 MG/2ML IV SOLN
INTRAVENOUS | Status: DC | PRN
  Administered 2019-07-03: 200 mg via INTRAVENOUS

## 2019-07-03 MED ORDER — SUFENTANIL CITRATE 50 MCG/ML IV SOLN
INTRAVENOUS | Status: AC
  Filled 2019-07-03: qty 1

## 2019-07-03 MED ORDER — BUPIVACAINE-EPINEPHRINE 0.25% -1:200000 IJ SOLN
INTRAMUSCULAR | Status: DC | PRN
  Administered 2019-07-03: 20 mL

## 2019-07-03 MED ORDER — GABAPENTIN 300 MG PO CAPS
300.0000 mg | ORAL_CAPSULE | Freq: Three times a day (TID) | ORAL | Status: DC
  Administered 2019-07-03 – 2019-07-04 (×3): 300 mg via ORAL
  Filled 2019-07-03 (×3): qty 1

## 2019-07-03 MED ORDER — PROPOFOL 10 MG/ML IV BOLUS
INTRAVENOUS | Status: DC | PRN
  Administered 2019-07-03: 170 mg via INTRAVENOUS
  Administered 2019-07-03: 30 mg via INTRAVENOUS

## 2019-07-03 MED ORDER — SODIUM CHLORIDE 0.9% FLUSH: 3.0000 mL | INTRAVENOUS | Status: DC | PRN

## 2019-07-03 SURGICAL SUPPLY — 68 items
BNDG GAUZE ELAST 4 BULKY (GAUZE/BANDAGES/DRESSINGS) ×3 IMPLANT
BUR EGG ELITE 4.0 (BURR) IMPLANT
BUR EGG ELITE 4.0MM (BURR)
BUR MATCHSTICK NEURO 3.0 LAGG (BURR) IMPLANT
CANISTER SUCT 3000ML PPV (MISCELLANEOUS) ×3 IMPLANT
CLOSURE STERI-STRIP 1/2X4 (GAUZE/BANDAGES/DRESSINGS) ×1
CLSR STERI-STRIP ANTIMIC 1/2X4 (GAUZE/BANDAGES/DRESSINGS) ×2 IMPLANT
CORD BIPOLAR FORCEPS 12FT (ELECTRODE) ×3 IMPLANT
COVER SURGICAL LIGHT HANDLE (MISCELLANEOUS) IMPLANT
COVER WAND RF STERILE (DRAPES) IMPLANT
DRAIN CHANNEL 15F RND FF W/TCR (WOUND CARE) IMPLANT
DRAPE POUCH INSTRU U-SHP 10X18 (DRAPES) ×3 IMPLANT
DRAPE SURG 17X23 STRL (DRAPES) ×3 IMPLANT
DRAPE U-SHAPE 47X51 STRL (DRAPES) ×3 IMPLANT
DRSG OPSITE POSTOP 3X4 (GAUZE/BANDAGES/DRESSINGS) IMPLANT
DRSG OPSITE POSTOP 4X6 (GAUZE/BANDAGES/DRESSINGS) ×3 IMPLANT
DURAPREP 26ML APPLICATOR (WOUND CARE) ×3 IMPLANT
ELECT BLADE 4.0 EZ CLEAN MEGAD (MISCELLANEOUS) ×3
ELECT CAUTERY BLADE 6.4 (BLADE) IMPLANT
ELECT PENCIL ROCKER SW 15FT (MISCELLANEOUS) ×3 IMPLANT
ELECT REM PT RETURN 9FT ADLT (ELECTROSURGICAL) ×3
ELECTRODE BLDE 4.0 EZ CLN MEGD (MISCELLANEOUS) ×1 IMPLANT
ELECTRODE REM PT RTRN 9FT ADLT (ELECTROSURGICAL) ×1 IMPLANT
EVACUATOR SILICONE 100CC (DRAIN) IMPLANT
GLOVE BIO SURGEON STRL SZ 6.5 (GLOVE) ×2 IMPLANT
GLOVE BIO SURGEONS STRL SZ 6.5 (GLOVE) ×1
GLOVE BIOGEL PI IND STRL 6.5 (GLOVE) ×2 IMPLANT
GLOVE BIOGEL PI IND STRL 8 (GLOVE) ×4 IMPLANT
GLOVE BIOGEL PI IND STRL 8.5 (GLOVE) ×1 IMPLANT
GLOVE BIOGEL PI INDICATOR 6.5 (GLOVE) ×4
GLOVE BIOGEL PI INDICATOR 8 (GLOVE) ×8
GLOVE BIOGEL PI INDICATOR 8.5 (GLOVE) ×2
GLOVE ECLIPSE 7.5 STRL STRAW (GLOVE) ×9 IMPLANT
GLOVE SS BIOGEL STRL SZ 8.5 (GLOVE) ×1 IMPLANT
GLOVE SUPERSENSE BIOGEL SZ 8.5 (GLOVE) ×2
GLOVE SURG SS PI 6.0 STRL IVOR (GLOVE) ×3 IMPLANT
GOWN STRL REUS W/ TWL LRG LVL3 (GOWN DISPOSABLE) ×2 IMPLANT
GOWN STRL REUS W/TWL 2XL LVL3 (GOWN DISPOSABLE) ×9 IMPLANT
GOWN STRL REUS W/TWL LRG LVL3 (GOWN DISPOSABLE) ×4
KIT BASIN OR (CUSTOM PROCEDURE TRAY) ×3 IMPLANT
KIT TURNOVER KIT B (KITS) ×3 IMPLANT
NEEDLE 22X1 1/2 (OR ONLY) (NEEDLE) ×3 IMPLANT
NEEDLE SPNL 18GX3.5 QUINCKE PK (NEEDLE) ×6 IMPLANT
NS IRRIG 1000ML POUR BTL (IV SOLUTION) ×3 IMPLANT
PACK LAMINECTOMY ORTHO (CUSTOM PROCEDURE TRAY) ×3 IMPLANT
PACK UNIVERSAL I (CUSTOM PROCEDURE TRAY) ×3 IMPLANT
PAD ARMBOARD 7.5X6 YLW CONV (MISCELLANEOUS) ×6 IMPLANT
PATTIES SURGICAL .5 X.5 (GAUZE/BANDAGES/DRESSINGS) ×3 IMPLANT
PATTIES SURGICAL .5 X1 (DISPOSABLE) ×3 IMPLANT
SPONGE SURGIFOAM ABS GEL 100 (HEMOSTASIS) ×3 IMPLANT
SPONGE SURGIFOAM ABS GEL SZ50 (HEMOSTASIS) ×3 IMPLANT
SURGIFLO W/THROMBIN 8M KIT (HEMOSTASIS) ×3 IMPLANT
SUT BONE WAX W31G (SUTURE) ×3 IMPLANT
SUT MON AB 3-0 SH 27 (SUTURE) ×2
SUT MON AB 3-0 SH27 (SUTURE) ×1 IMPLANT
SUT VIC AB 0 CT1 27 (SUTURE) ×2
SUT VIC AB 0 CT1 27XBRD ANBCTR (SUTURE) ×1 IMPLANT
SUT VIC AB 1 CT1 18XCR BRD 8 (SUTURE) ×1 IMPLANT
SUT VIC AB 1 CT1 8-18 (SUTURE) ×2
SUT VIC AB 2-0 CT1 18 (SUTURE) ×3 IMPLANT
SYR 3ML LL SCALE MARK (SYRINGE) ×6 IMPLANT
SYR BULB IRRIGATION 50ML (SYRINGE) ×3 IMPLANT
SYR CONTROL 10ML LL (SYRINGE) ×3 IMPLANT
SYR TB 1ML 25GX5/8 (SYRINGE) ×3 IMPLANT
TOWEL GREEN STERILE (TOWEL DISPOSABLE) ×3 IMPLANT
TOWEL GREEN STERILE FF (TOWEL DISPOSABLE) ×3 IMPLANT
WATER STERILE IRR 1000ML POUR (IV SOLUTION) ×3 IMPLANT
YANKAUER SUCT BULB TIP NO VENT (SUCTIONS) ×3 IMPLANT

## 2019-07-03 NOTE — Anesthesia Preprocedure Evaluation (Addendum)
Anesthesia Evaluation  Patient identified by MRN, date of birth, ID band Patient awake    Reviewed: Allergy & Precautions, NPO status , Patient's Chart, lab work & pertinent test results  History of Anesthesia Complications Negative for: history of anesthetic complications  Airway Mallampati: II  TM Distance: >3 FB Neck ROM: Full  Mouth opening: Limited Mouth Opening  Dental  (+) Partial Upper,    Pulmonary Current Smoker,    Pulmonary exam normal        Cardiovascular negative cardio ROS Normal cardiovascular exam     Neuro/Psych L4-5 herniated disk    GI/Hepatic negative GI ROS, Neg liver ROS,   Endo/Other  negative endocrine ROS  Renal/GU negative Renal ROS     Musculoskeletal negative musculoskeletal ROS (+)   Abdominal   Peds  Hematology negative hematology ROS (+)   Anesthesia Other Findings Day of surgery medications reviewed with the patient.  Reproductive/Obstetrics                            Anesthesia Physical Anesthesia Plan  ASA: II  Anesthesia Plan: General   Post-op Pain Management:    Induction: Intravenous  PONV Risk Score and Plan: 2 and Treatment may vary due to age or medical condition, Ondansetron, Dexamethasone and Midazolam  Airway Management Planned: Oral ETT  Additional Equipment:   Intra-op Plan:   Post-operative Plan: Extubation in OR  Informed Consent: I have reviewed the patients History and Physical, chart, labs and discussed the procedure including the risks, benefits and alternatives for the proposed anesthesia with the patient or authorized representative who has indicated his/her understanding and acceptance.     Dental advisory given  Plan Discussed with: CRNA  Anesthesia Plan Comments:        Anesthesia Quick Evaluation

## 2019-07-03 NOTE — Progress Notes (Signed)
Patient on PACU hold, waiting for 3 Central nurse to call back, first report attempt was at 1417 pm.

## 2019-07-03 NOTE — Discharge Instructions (Signed)
Surgical Spinal Decompression, Care After Refer to this sheet in the next few weeks. These instructions provide you with information about caring for yourself after your procedure. Your health care provider may also give you more specific instructions. Your treatment has been planned according to current medical practices, but problems sometimes occur. Call your health care provider if you have any problems or questions after your procedure. What can I expect after the procedure? It is common to have pain for the first few days after the procedure. Some people continue to have mild pain even after making a full recovery. Follow these instructions at home: Medicine  Take medicines only as directed by your health care provider.  Avoid taking over-the-counter pain medicines unless your health care provider tells you otherwise. These medicines interfere with the development and growth of new bone cells.  If you were prescribed a narcotic pain medicine, take it exactly as told by your health care provider. ? Do not drink alcohol while on the medicine. ? Do not drive while on the medicine. Injury care  Care for your back brace as told by your health care provider.  If directed, apply ice to the injured area: ? Put ice in a plastic bag. ? Place a towel between your skin and the bag. ? Leave the ice on for 20 minutes, 2-3 times a day. Activity  Perform physical therapy exercises as told by your health care provider.  Exercise regularly. Start by taking short walks. Slowly increase your activity level over time. Gentle exercise helps to ease pain.  Sit, stand, walk, turn in bed, and reposition yourself as told by your health care provider. This will help to keep your spine in proper alignment.  Avoid bending and twisting your body.  Avoid doing strenuous household chores, such as vacuuming.  Do not lift anything that is heavier than 10 lb (4.5 kg). Other Instructions  Keep all follow-up  visits as directed by your health care provider. This is important.  Do not use any tobacco products, including cigarettes, chewing tobacco, or electronic cigarettes. If you need help quitting, ask your health care provider. Nicotine affects the way bones heal. Contact a health care provider if:  Your pain gets worse.  You have a fever.  You have redness, swelling, or pain at the site of your incision.  You have fluid, blood, or pus coming from your incision.  You have numbness, tingling, or weakness in any part of your body. Get help right away if:  Your incision feels swollen and tender, and the surrounding area looks like a lump. The lump may be red or bluish in color.  You cannot move any part of your body (paralysis).  You cannot control your bladder or bowels. This information is not intended to replace advice given to you by your health care provider. Make sure you discuss any questions you have with your health care provider.

## 2019-07-03 NOTE — Progress Notes (Signed)
Attempted report x 2, floor nurse unavailable.

## 2019-07-03 NOTE — Op Note (Signed)
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  First Assistant: Cleta Alberts, PA  Intraoperative findings: Small disc protrusion central and posterior lateral to the left at L4-5.  Large epidural venous plexus causing marked compression of both the traversing L5 and S1 nerve roots.  After coagulating the epidural veins and extending the L4 hemilaminotomy cephalad and performing a superior partial hemilaminotomy of L5 I had adequately decompressed the L5 nerve root as well as the S1 nerve root.  No CSF leak was noted at the conclusion of the case.  Indications: Tim West is a very pleasant 63 year old gentleman who presented with significant neuropathic left leg pain.  Attempts at conservative management failed to alleviate his symptoms.  Preop imaging demonstrated nerve compression.  As a result of the ongoing significant radicular leg pain we elected to move forward with surgery.  All appropriate risks benefits and alternatives were discussed with the Tim West and consent was obtained.  Operative note: Tim West is brought the operating room placed upon the operating room table.  After successful induction of general anesthesia and endotracheal Tim West teds SCDs were applied and he was turned prone onto the Wilson frame.  All bony prominences well-padded and the lumbar spine was prepped and draped in a standard fashion.  Timeout was taken to confirm Tim West procedure and all other important data.  2 needles were placed into the back and x-ray was taken to localize the incision.  I marked out the incision site and infiltrated with quarter percent Marcaine with epinephrine.  Midline incision was made and sharp dissection was carried out down  to the deep fascia.  I then injected the paraspinal muscles on the left side with quarter percent Marcaine with epinephrine in order to aid in postoperative hemostasis and analgesia.  I then incised the deep fascia and stripped the paraspinal muscles to expose the L4 and L5 spinous process and facet complex.  I then placed a Penfield 4 underneath the L4 lamina and took an intraoperative x-ray confirming I was at the appropriate level.  A laminotomy was performed with a 3 mm Kerrison Roger and I resected the medial portion of the facet of L4.  I then released the ligamentum flavum from the leading edge of L5 and used my Penfield 4 to gently dissect through the ligamentum flavum I created a plane between the ligamentum flavum and the thecal sac and then used a 2 mm Kerrison Roger to remove the ligamentum flavum.  At this point as I began gently dissecting into the lateral recess I then noted that the L5 nerve root was displaced laterally.  Upon reevaluation of the MRI I felt as though the disc fragment was in the axilla.  As I began to develop a plane along the lateral border of the L5 nerve root I was able to gently resect the medial aspect of the facet to adequately decompress the nerve.  I then began gently working in the axilla where I noted 3 very large epidural veins.  At this point I felt as though it was the large epidural veins which were primarily responsible for the dorsal and lateral displacement of the L5 nerve root and the Tim West's significant neuropathic leg pain.  I was able to identify the disc space and I did incised with a 15 blade  scalpel.  I then used my Epstein curette to probe and resect some calcified disc material but there was no large fragment of disc material.  I then continued to isolate the epidural vein and then coagulated with bipolar electrocautery.  There was significant bleeding from these epidural veins which I ultimately was able to control with bipolar cautery and thrombin-soaked  Gelfoam patties.  Once I had hemostasis I was able to notify the traversing S1 nerve root.  I continued to decompress the nerve by removing the superior portion of the L5 lamina.  At this point the L5 nerve root was clearly visualized from the superior aspect of the L4-5 disc space across the L4-5 disc space and into the L5 foramen.  The axillary epidural vein plexus was coagulated and the nerve root itself was no longer under tension.  I could now freely pass my Woodson elevator superiorly inferiorly and out the foramen confirming I had decompressed the area of maximum L5 neurocompression from the midportion of the L4-5 disc down to about the midportion of the L5 vertebral body.  With hemostasis obtained I then irrigated the wound copiously normal saline.  Because of the neuro compression and need for retraction both on the lateral side as well as in the axilla I did place 1 cc (40 mg) of Depo-Medrol over the wound for postoperative analgesia.  Thrombin-soaked Gelfoam patty was then placed over the entire laminotomy site and then I closed the deep fascia with interrupted #1 Vicryl sutures.  Superficial was closed with 2-0 Vicryl suture and the skin with 3-0 Monocryl.  Steri-Strips and dry dressings were applied and the Tim West was ultimately extubated transfer the PACU without incident.  The end of the case all needle sponge counts were correct.  There were no adverse intraoperative events.

## 2019-07-03 NOTE — Transfer of Care (Signed)
Immediate Anesthesia Transfer of Care Note  Patient: Tim West  Procedure(s) Performed: Left Lumbar four-five disectomy (Left Back)  Patient Location: PACU  Anesthesia Type:General  Level of Consciousness: drowsy and patient cooperative  Airway & Oxygen Therapy: Patient Spontanous Breathing and Patient connected to nasal cannula oxygen  Post-op Assessment: Report given to RN, Post -op Vital signs reviewed and stable and Patient moving all extremities  Post vital signs: Reviewed and stable  Last Vitals:  Vitals Value Taken Time  BP 118/66 07/03/19 1400  Temp    Pulse 93 07/03/19 1402  Resp 16 07/03/19 1402  SpO2 94 % 07/03/19 1402  Vitals shown include unvalidated device data.  Last Pain:  Vitals:   07/03/19 0925  TempSrc:   PainSc: 0-No pain         Complications: No apparent anesthesia complications

## 2019-07-03 NOTE — Brief Op Note (Signed)
07/03/2019  1:48 PM  PATIENT:  Tim West  63 y.o. male  PRE-OPERATIVE DIAGNOSIS:  Left Lumbar four-five herniated disc  POST-OPERATIVE DIAGNOSIS:  Left Lumbar four-five herniated disc  PROCEDURE:  Procedure(s): Left Lumbar four-five disectomy (Left)  SURGEON:  Surgeon(s) and Role:    Melina Schools, MD - Primary  PHYSICIAN ASSISTANT:   ASSISTANTS: Amanda Ward, PA   ANESTHESIA:   general  EBL:  200 mL   BLOOD ADMINISTERED:none  DRAINS: none   LOCAL MEDICATIONS USED:  MARCAINE    and OTHER depomedrol  SPECIMEN:  No Specimen  DISPOSITION OF SPECIMEN:  N/A  COUNTS:  YES  TOURNIQUET:  * No tourniquets in log *  DICTATION: .Dragon Dictation  PLAN OF CARE: Admit for overnight observation  PATIENT DISPOSITION:  PACU - hemodynamically stable. Marland Kitchen

## 2019-07-03 NOTE — Anesthesia Procedure Notes (Addendum)
Procedure Name: Intubation Date/Time: 07/03/2019 11:22 AM Performed by: [name], MD Pre-anesthesia Checklist: Patient identified, Emergency Drugs available, Suction available and Patient being monitored Patient Re-evaluated:Patient Re-evaluated prior to induction Oxygen Delivery Method: Circle System Utilized Preoxygenation: Pre-oxygenation with 100% oxygen Induction Type: IV induction Ventilation: Mask ventilation without difficulty Laryngoscope Size: Glidescope and 4 Tube type: Oral Tube size: 8.0 mm Number of attempts: 2 Airway Equipment and Method: Video-laryngoscopy and Rigid stylet Placement Confirmation: ETT inserted through vocal cords under direct vision,  positive ETCO2 and breath sounds checked- equal and bilateral Secured at: 26 cm Tube secured with: Tape Dental Injury: Teeth and Oropharynx as per pre-operative assessment  Difficulty Due To: Difficulty was unanticipated and Difficult Airway- due to anterior larynx Future Recommendations: Recommend- induction with short-acting agent, and alternative techniques readily available Comments: First attempt by CRNA with reported grade 3 view with Mac blade, esophageal intubation immediately recognized with no ETCO2, ETT removed. Mask ventilated before next attempt with Glidescope with SpO2>98% throughout. Successful intubation with Glidescope #4. Atraumatic. Recommend Glidescope for future intubations. Erasmo Downer, MD

## 2019-07-03 NOTE — H&P (Signed)
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 Rolena Infante. He has been complaining of low back and radicular leg pain since the few months prior to his first visit with me on 05/09/2019, and despite conservative measures including time in various prescription medications such as muscle relaxers and gabapentin, the patient continues to have pain and a unacceptable quality-of-life and therefore he would like to move forward with surgical intervention.  Allergies NKDA  Medications etodolac 400 mg tablet gabapentin 300 mg capsule Linzess 145 mcg capsule  Problems Pain in lumbar spine - Onset: 05/08/2019 Lumbar radiculopathy - Onset: 05/09/2019  Surgical History Appendectomy Tonsillectomy  Physical Exam Clinical exam: Tim West is a pleasant individual, who appears younger than their stated age. He Is alert and orientated 3.   Cardiovascular: RRR, no rubs, murmers, or gallops  Lungs: No shortness of breath, chest pain.   Abdomen is soft and non-tender, negative loss of bowel and bladder control, no rebound tenderness. BSX4  Negative: skin lesions abrasions contusions .   Peripheral pulses: 2+ dorsalis pedis/posterior tibialis pulses bilaterally. Compartment soft and nontender.  Gait pattern: Antalgic gait pattern due to radicular left leg pain.  Assistive devices: None  Neuro: Positive left straight leg raise test in the seated and supine position. 5/5 motor strength bilateral in the lower extremity with the exception of weakness in the EHL and tibialis anterior motor groups. Positive numbness and dysesthesias primarily in the L5 dermatome on the left side. Negative Babinski test, no clonus, 2+ deep tendon reflexes at the knee and  Achilles.  Musculoskeletal: Moderate low back pain with palpation and range of motion. No SI joint pain with direct palpation. No significant hip, knee, ankle pain with isolated joint range of motion.  X-rays of the lumbar spine demonstrate mild degenerative changes but no significant collapse of the disc spaces. No spondylolisthesis or spondylolysis. No scoliosis noted.  Lumbar MRI: completed on 05/15/19 was reviewed with the patient. I have also reviewed the radiology report. Moderate left disc protrusion at L4-5 producing displacement of the left L5 nerve root. Mild right neural foraminal narrowing L4-5. Moderate right L5-S1 facet arthrosis. No significant disease at the remainder of the lumbar spine. No fracture or abnormal marrow replacing lesion.  Assessment & Plan Assessment: Tim West is a very pleasant 63 year old gentleman with progressive left radicular leg pain. He describes numbness dysesthesias in the left L5 dermatome, and now has weakness in the EHL and tibialis anterior motor groups. The positive foot drop is consistent with L5 motor deficits. Although his MRI does not show a large disc herniation it does show the disc causing L5 nerve compression which would account for his current symptoms. As a result of the progressive neurological deficits, and the loss in quality-of-life he would like to move forward with surgery.  Plan:  We have discussed a micro discectomy in great detail. I views the anatomical model as well as the animated surgical video to explain the procedure. I have also spoken to his wife on the phone to discuss the MRI and surgical plan. They have both expressed an understanding of the surgery as well as the risks and benefits.    Risks and benefits of surgery were discussed with the patient. These include: Infection, bleeding, death, stroke, paralysis, ongoing or worse  pain, need for additional surgery, leak of spinal fluid, adjacent segment degeneration requiring  additional surgery, post-operative hematoma formation that can result in neurological compromise and the need for urgent/emergent re-operation. Loss in bowel and bladder control. Injury to major vessels that could result in the need for urgent abdominal surgery to stop bleeding. Risk of deep venous thrombosis (DVT) and the need for additional treatment. Recurrent disc herniation resulting in the need for revision surgery, which could include fusion surgery (utilizing instrumentation such as pedicle screws and intervertebral cages).     We have also discussed the goals of surgery to include:   Goals of surgery: Reduction in pain, and improvement in quality of life.    We have also discussed the post-operative recovery period to include: bathing/showering restrictions, wound healing, activity (and driving) restrictions, medications/pain mangement.    We have also discussed post-operative redflags to include: signs and symptoms of postoperative infection, DVT/PE.    The patient is scheduled today for his LSO brace fitting. At this point, he has been unable to obtain PCP clearance as his PCP is on leave and he has not been able to find an urgent care willing to give him a PCP clearance; per Dr. Rolena Infante we may proceed with the preop anesthesia evaluation which is scheduled for next week.    All patients questions were invited and answered.

## 2019-07-03 NOTE — Anesthesia Postprocedure Evaluation (Signed)
Anesthesia Post Note  Patient: Tim West  Procedure(s) Performed: Left Lumbar four-five disectomy (Left Back)     Patient location during evaluation: PACU Anesthesia Type: General Level of consciousness: awake and alert and oriented Pain management: pain level controlled Vital Signs Assessment: post-procedure vital signs reviewed and stable Respiratory status: spontaneous breathing, nonlabored ventilation and respiratory function stable Cardiovascular status: blood pressure returned to baseline Postop Assessment: no apparent nausea or vomiting Anesthetic complications: no    Last Vitals:  Vitals:   07/03/19 1430 07/03/19 1507  BP: 110/66 123/84  Pulse: 88 75  Resp: 20 18  Temp: (!) 36.4 C 36.5 C  SpO2: 95%     Last Pain:  Vitals:   07/03/19 1507  TempSrc: Oral  PainSc:                  [name]

## 2019-07-04 ENCOUNTER — Encounter (HOSPITAL_COMMUNITY): Payer: Self-pay | Admitting: Orthopedic Surgery

## 2019-07-04 DIAGNOSIS — M5116 Intervertebral disc disorders with radiculopathy, lumbar region: Secondary | ICD-10-CM | POA: Diagnosis not present

## 2019-07-04 NOTE — Progress Notes (Signed)
Subjective: 1 Day Post-Op Procedure(s) (LRB): Left Lumbar four-five disectomy (Left) Patient reports pain as mild.   Tolerating PO without nausea or vomiting +ambulation +void +Flatus, - BM Denies HA, dizziness, SOB, CP, calf pain, sweats/chills.  Objective: Vital signs in last 24 hours: Temp:  [97.2 F (36.2 C)-99.3 F (37.4 C)] 99.1 F (37.3 C) (08/07 0729) Pulse Rate:  [69-91] 69 (08/07 0729) Resp:  [18-20] 19 (08/07 0729) BP: (104-128)/(58-84) 117/73 (08/07 0729) SpO2:  [93 %-96 %] 94 % (08/07 0729)  Intake/Output from previous day: 08/06 0701 - 08/07 0700 In: 1635.1 [P.O.:240; I.V.:1295.1; IV Piggyback:100] Out: 200 [Blood:200] Intake/Output this shift: No intake/output data recorded.  No results for input(s): HGB in the last 72 hours. No results for input(s): WBC, RBC, HCT, PLT in the last 72 hours. No results for input(s): NA, K, CL, CO2, BUN, CREATININE, GLUCOSE, CALCIUM in the last 72 hours. No results for input(s): LABPT, INR in the last 72 hours.  Neurologically intact ABD soft Neurovascular intact Sensation intact distally Intact pulses distally Dorsiflexion/Plantar flexion intact Incision: dressing C/D/I No cellulitis present Compartment soft   Assessment/Plan: 1 Day Post-Op Procedure(s) (LRB): Left Lumbar four-five disectomy (Left) Advance diet Up with therapy  DVT PPx: Teds, SCDs, ambulation Encourage IS Pain well controlled. Plan D/C home today  F/u in office in two weeks   Tim West 07/04/2019, 9:30 AM

## 2019-07-04 NOTE — Plan of Care (Signed)
Patient alert and oriented, mae's well, voiding adequate amount of urine, swallowing without difficulty, no c/o pain at time of discharge. Patient discharged home with family. Script and discharged instructions given to patient. Patient and family stated understanding of instructions given. Patient has an appointment with Dr. Brooks  

## 2019-07-04 NOTE — Evaluation (Signed)
Physical Therapy Evaluation Patient Details Name: Tim West MRN: [id number] DOB: July 08, 1956 Today's Date: 07/04/2019   History of Present Illness  Pt is a 63 yo male s/p L lumbar 4-5 discectomy with pain in L calf only. No significant PMH noted.  Clinical Impression  Pt admitted with above diagnosis. Pt currently with functional limitations due to the deficits listed below (see PT Problem List). At the time of PT eval pt was mobilizing well at the min guard to supervision level without an AD. Pt was educated on back precautions, car transfer, brace application/wearing schedule, and appropriate activity progression. Pt will benefit from skilled PT to increase their independence and safety with mobility to allow discharge to the venue listed below.       Follow Up Recommendations No PT follow up;Supervision - Intermittent    Equipment Recommendations  None recommended by PT    Recommendations for Other Services       Precautions / Restrictions Precautions Precautions: Back Precaution Booklet Issued: Yes (comment) Precaution Comments: Reviewed handout and pt was cued for precautions during functional mobility.  Required Braces or Orthoses: Spinal Brace Spinal Brace: Lumbar corset;Applied in sitting position Restrictions Weight Bearing Restrictions: No      Mobility  Bed Mobility Overal bed mobility: Needs Assistance Bed Mobility: Sidelying to Sit;Sit to Sidelying   Sidelying to sit: Supervision     Sit to sidelying: Supervision General bed mobility comments: Pt demonstrated good log roll technique with min use of rails for support. Supervision for safety.   Transfers Overall transfer level: Needs assistance Equipment used: None Transfers: Sit to/from Stand Sit to Stand: Supervision         General transfer comment: Light supervision as pt powered up to full stand. VC's for improved posture with stand>sit.   Ambulation/Gait Ambulation/Gait assistance:  Supervision Gait Distance (Feet): 400 Feet Assistive device: None Gait Pattern/deviations: Step-through pattern;Decreased stride length;Trunk flexed Gait velocity: Decreased Gait velocity interpretation: 1.31 - 2.62 ft/sec, indicative of limited community ambulator General Gait Details: VC's for improved posture. Pt ambulating fairly well with occasional mild LOB however did not require assist to recover.   Stairs            Wheelchair Mobility    Modified Rankin (Stroke Patients Only)       Balance Overall balance assessment: Mild deficits observed, not formally tested                                           Pertinent Vitals/Pain Pain Assessment: Faces Pain Score: 3  Faces Pain Scale: Hurts a little bit Pain Location: L calf Pain Descriptors / Indicators: Discomfort Pain Intervention(s): Monitored during session;Repositioned    Home Living Family/patient expects to be discharged to:: Private residence Living Arrangements: Spouse/significant other Available Help at Discharge: Family;Available 24 hours/day Type of Home: House Home Access: Level entry     Home Layout: One level Home Equipment: Walker - 4 wheels      Prior Function Level of Independence: Independent with assistive device(s)         Comments: ambualtory with rollator. reports independence with ADL.     Hand Dominance   Dominant Hand: Right    Extremity/Trunk Assessment   Upper Extremity Assessment Upper Extremity Assessment: Defer to OT evaluation    Lower Extremity Assessment Lower Extremity Assessment: Generalized weakness(Consistent with pre-op diagnosis)    Cervical /  Trunk Assessment Cervical / Trunk Assessment: Other exceptions Cervical / Trunk Exceptions: s/p back sx  Communication   Communication: No difficulties  Cognition Arousal/Alertness: Awake/alert Behavior During Therapy: WFL for tasks assessed/performed Overall Cognitive Status: Within  Functional Limits for tasks assessed                                        General Comments General comments (skin integrity, edema, etc.): Pt seated for breakfast and found ambulating in hallway without brace- required cues for wearing schedule.    Exercises     Assessment/Plan    PT Assessment Patient needs continued PT services  PT Problem List Decreased strength;Decreased activity tolerance;Decreased balance;Decreased mobility;Decreased knowledge of use of DME;Decreased safety awareness;Decreased knowledge of precautions;Pain       PT Treatment Interventions DME instruction;Gait training;Functional mobility training;Therapeutic activities;Therapeutic exercise;Neuromuscular re-education;Patient/family education    PT Goals (Current goals can be found in the Care Plan section)  Acute Rehab PT Goals Patient Stated Goal: to go home PT Goal Formulation: With patient Time For Goal Achievement: 07/11/19 Potential to Achieve Goals: Good    Frequency Min 5X/week   Barriers to discharge        Co-evaluation               AM-PAC PT "6 Clicks" Mobility  Outcome Measure Help needed turning from your back to your side while in a flat bed without using bedrails?: None Help needed moving from lying on your back to sitting on the side of a flat bed without using bedrails?: None Help needed moving to and from a bed to a chair (including a wheelchair)?: None Help needed standing up from a chair using your arms (e.g., wheelchair or bedside chair)?: None Help needed to walk in hospital room?: A Little Help needed climbing 3-5 steps with a railing? : A Little 6 Click Score: 22    End of Session Equipment Utilized During Treatment: Back brace Activity Tolerance: Patient tolerated treatment well Patient left: in chair;with call bell/phone within reach Nurse Communication: Mobility status PT Visit Diagnosis: Unsteadiness on feet (R26.81);Pain Pain - part of body:  (back)    Time: 8811-0315 PT Time Calculation (min) (ACUTE ONLY): 21 min   Charges:   PT Evaluation $PT Eval Moderate Complexity: 1 Mod          Tim West, PT, DPT Acute Rehabilitation Services Pager: [phone] Office: [phone]   Tim West 07/04/2019, 11:13 AM

## 2019-07-04 NOTE — Evaluation (Signed)
Occupational Therapy Evaluation Patient Details Name: Tim West MRN: [id number] DOB: 1956/08/03 Today's Date: 07/04/2019    History of Present Illness Pt is a 63 yo male s/p L lumbar 4-5 discectomy with pain in L calf only. No PMHx.   Clinical Impression   Pt PTA: living with spouse, 24/7 care if needed. Pt reports independence with ADL and rollator for mobility. Back handout provided and reviewed ADL in detail. Pt education performed on LB AE as pt unable to perform figure 4 technique. Pt modified independent with LB ADL with AE items. Pt grooming at sink with 2 cups to abide by back precautions. Pt ambulating with no AD. Pt educated on: clothing between brace, never sleep in brace, set an alarm at night for medication, avoid sitting for long periods of time, correct bed positioning for sleeping, correct sequence for bed mobility, avoiding lifting more than 5 pounds and never wash directly over incision. All education is complete and patient indicates understanding. Pt did not express additional concerns. OT signing off. No further skilled OT required.      Follow Up Recommendations  No OT follow up    Equipment Recommendations  None recommended by OT    Recommendations for Other Services       Precautions / Restrictions Precautions Precautions: Back Precaution Booklet Issued: Yes (comment) Precaution Comments: verbal discussion for spinal precautions and handout Required Braces or Orthoses: Spinal Brace Restrictions Weight Bearing Restrictions: No      Mobility Bed Mobility Overal bed mobility: Needs Assistance Bed Mobility: Sidelying to Sit;Sit to Sidelying   Sidelying to sit: Supervision     Sit to sidelying: Supervision General bed mobility comments: increased time and verbal cueing for technique for log roll  Transfers Overall transfer level: Needs assistance   Transfers: Sit to/from Stand Sit to Stand: Modified independent (Device/Increase time)               Balance Overall balance assessment: No apparent balance deficits (not formally assessed)                                         ADL either performed or assessed with clinical judgement   ADL Overall ADL's : Needs assistance/impaired Eating/Feeding: Modified independent   Grooming: Modified independent   Upper Body Bathing: Modified independent   Lower Body Bathing: Set up;Sitting/lateral leans;Sit to/from stand;With adaptive equipment   Upper Body Dressing : Modified independent   Lower Body Dressing: Modified independent;With adaptive equipment;Cueing for safety;Sit to/from stand;Sitting/lateral leans   Toilet Transfer: Supervision/safety;Regular Museum/gallery exhibitions officer and Hygiene: Supervision/safety       Functional mobility during ADLs: Modified independent General ADL Comments: Pt education performed on LB AE as pt unable to perform figure 4 technique. Pt modified independent with LB ADL with AE items. Pt grooming at sink with 2 cups to abide by back precautions.     Vision Baseline Vision/History: Wears glasses Vision Assessment?: No apparent visual deficits     Perception     Praxis      Pertinent Vitals/Pain Pain Assessment: 0-10 Pain Score: 3  Pain Location: L calf Pain Descriptors / Indicators: Discomfort Pain Intervention(s): Monitored during session     Hand Dominance Right   Extremity/Trunk Assessment Upper Extremity Assessment Upper Extremity Assessment: Overall WFL for tasks assessed   Lower Extremity Assessment Lower Extremity Assessment: Generalized weakness;Defer to PT evaluation  Cervical / Trunk Assessment Cervical / Trunk Assessment: Other exceptions Cervical / Trunk Exceptions: s/p back sx   Communication Communication Communication: No difficulties   Cognition Arousal/Alertness: Awake/alert Behavior During Therapy: WFL for tasks assessed/performed Overall Cognitive Status: Within  Functional Limits for tasks assessed                                     General Comments  Pt seated for breakfast and found ambulating in hallway without brace- required cues for wearing schedule.    Exercises     Shoulder Instructions      Home Living Family/patient expects to be discharged to:: Private residence Living Arrangements: Spouse/significant other Available Help at Discharge: Family;Available 24 hours/day Type of Home: House Home Access: Level entry     Home Layout: One level     Bathroom Shower/Tub: Tub/shower unit;Walk-in shower   Bathroom Toilet: Handicapped height     Home Equipment: Walker - 4 wheels          Prior Functioning/Environment Level of Independence: Independent with assistive device(s)        Comments: ambualtory with rollator. reports independence with ADL.        OT Problem List: Pain      OT Treatment/Interventions:      OT Goals(Current goals can be found in the care plan section) Acute Rehab OT Goals Patient Stated Goal: to go home Potential to Achieve Goals: Good  OT Frequency:     Barriers to D/C:            Co-evaluation              AM-PAC OT "6 Clicks" Daily Activity     Outcome Measure Help from another person eating meals?: None Help from another person taking care of personal grooming?: None Help from another person toileting, which includes using toliet, bedpan, or urinal?: None Help from another person bathing (including washing, rinsing, drying)?: A Little Help from another person to put on and taking off regular upper body clothing?: None Help from another person to put on and taking off regular lower body clothing?: A Little 6 Click Score: 22   End of Session Equipment Utilized During Treatment: Back brace  Activity Tolerance: Patient tolerated treatment well Patient left: Other (comment)(with physical therapy in hallway)  OT Visit Diagnosis: Muscle weakness (generalized)  (M62.81);Pain Pain - Right/Left: Left Pain - part of body: Leg                Time: 0820-0840 OT Time Calculation (min): 20 min Charges:  OT General Charges $OT Visit: 1 Visit OT Evaluation $OT Eval Moderate Complexity: 1 Mod  Tim West) Marsa Aris OTR/L Acute Rehabilitation Services Pager: [phone] Office: [place] 07/04/2019, 9:00 AM

## 2019-10-05 ENCOUNTER — Other Ambulatory Visit: Payer: Self-pay | Admitting: Internal Medicine

## 2019-10-05 DIAGNOSIS — Z86010 Personal history of colonic polyps: Secondary | ICD-10-CM

## 2020-10-30 ENCOUNTER — Other Ambulatory Visit: Payer: Self-pay | Admitting: Internal Medicine

## 2020-10-30 DIAGNOSIS — Z86010 Personal history of colonic polyps: Secondary | ICD-10-CM

## 2021-02-02 IMAGING — CR LUMBAR SPINE - 2-3 VIEW
1 series · 1 of 1 positions shown · non-contrast
Comparison: None.

CLINICAL DATA: Localization image for L4-5 discectomy

EXAM:
LUMBAR SPINE - 2-3 VIEW

[lateral]
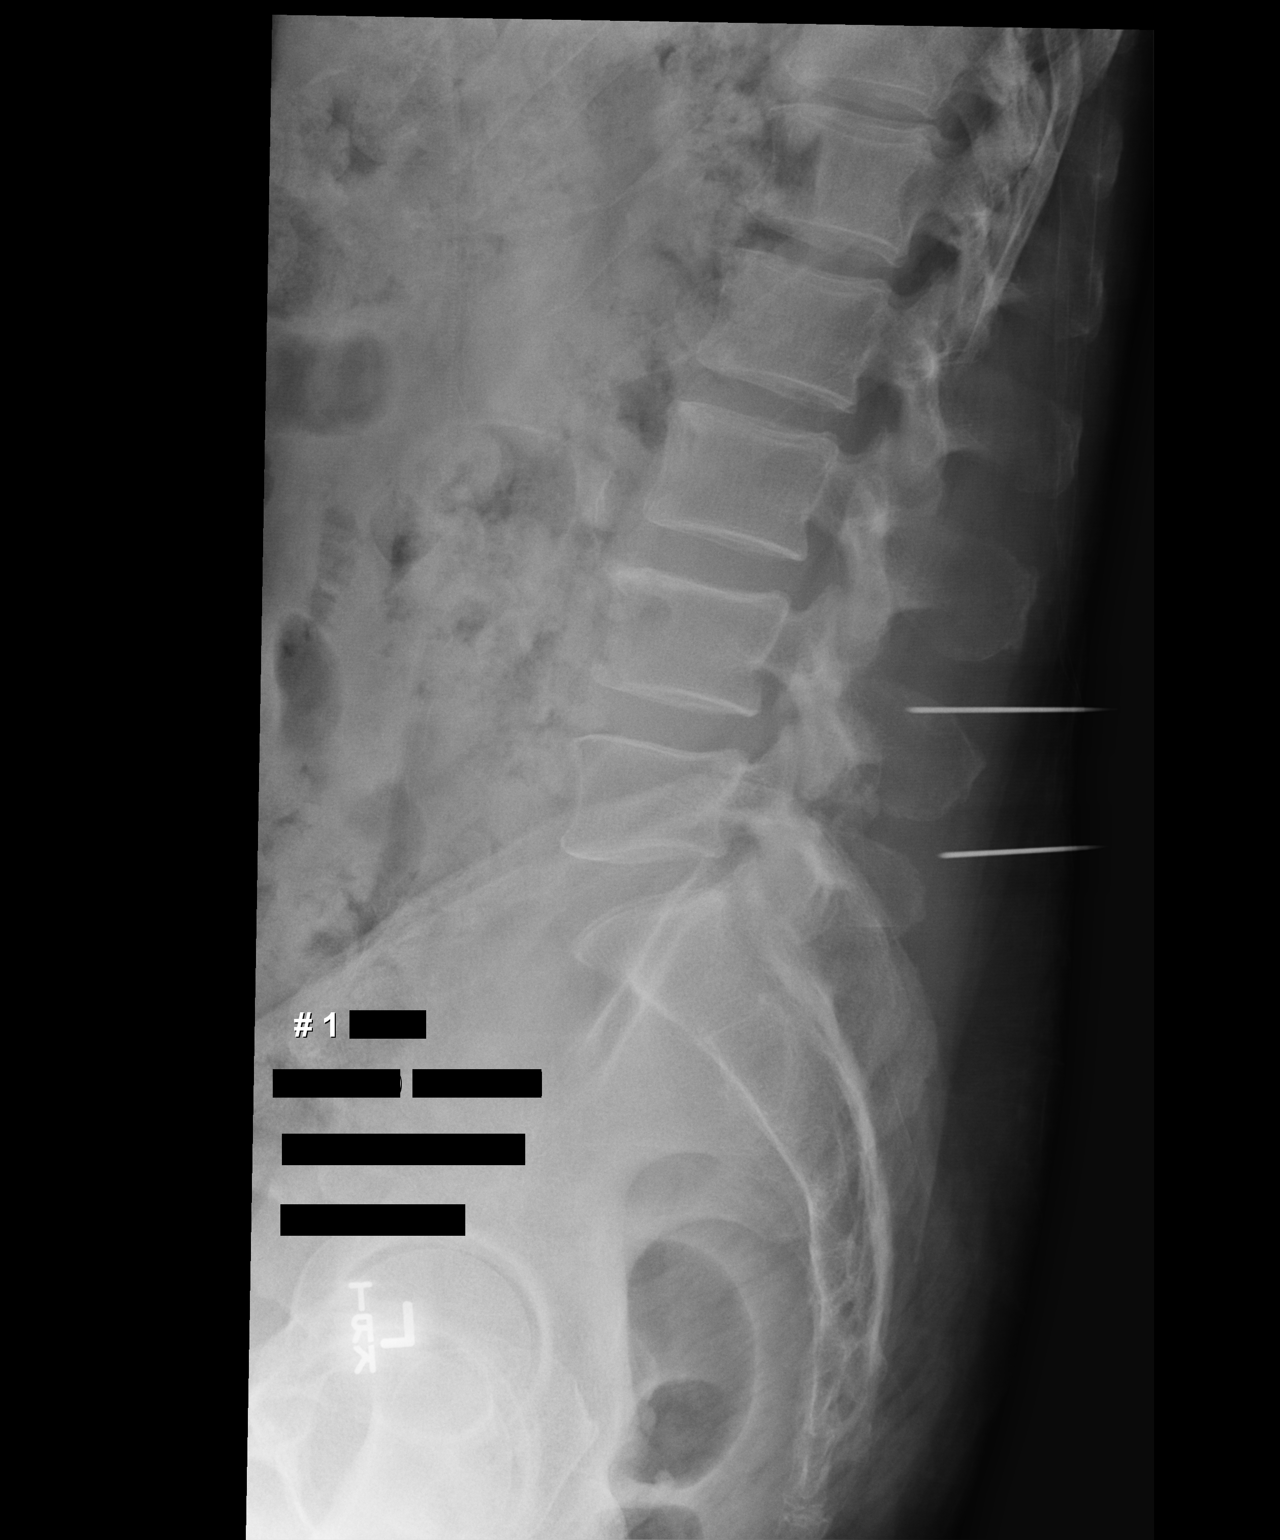

[1 of 1 positions shown; findings below may reference images not displayed]

FINDINGS: Single lateral radiograph obtained 07/03/2019 at [DATE] a.m.
demonstrates a superior needle localization at the spinous process
of L4 and a second more inferiorly in the interspinous space between
the L4-5 spinous processes. Facet degenerative changes are present
L4-5 and L5-S1.
IMPRESSION: Intraoperative localization as described above.

## 2021-11-13 ENCOUNTER — Encounter: Payer: Self-pay | Admitting: Internal Medicine

## 2022-06-23 ENCOUNTER — Encounter: Payer: Self-pay | Admitting: Internal Medicine

## 2022-06-27 ENCOUNTER — Encounter: Payer: Self-pay | Admitting: *Deleted

## 2022-06-27 ENCOUNTER — Telehealth: Payer: Self-pay | Admitting: *Deleted

## 2022-06-27 DIAGNOSIS — T884XXA Failed or difficult intubation, initial encounter: Secondary | ICD-10-CM | POA: Insufficient documentation

## 2022-06-27 NOTE — Telephone Encounter (Signed)
Dr Hilarie Fredrickson,   In reviewing this pt's chart for his upcoming PV noted to have had a difficult intubation in 2020 with a lumbar surgery   See not below and advise OV vs Direct at [facility]  Thanks, Lelan Pons    Difficulty Due To: Difficulty was unanticipated and Difficult Airway- due to anterior larynx Future Recommendations: Recommend- induction with short-acting agent, and alternative techniques readily available Comments: First attempt by CRNA with reported grade 3 view with Mac blade, esophageal intubation immediately recognized with no ETCO2, ETT removed. Mask ventilated before next attempt with Glidescope with SpO2>98% throughout. Successful intubation with Glidescope #4. Atraumatic. Recommend Glidescope for future intubations. Erasmo Downer, MD

## 2022-06-27 NOTE — Telephone Encounter (Signed)
Dr Hilarie Fredrickson,   In reviewing this pt's chart for his upcoming PV noted to have had a difficult intubation in 2020 with a lumbar surgery   See not below and advise OV vs Direct at [facility]   Thanks, Lelan Pons     Difficulty Due To: Difficulty was unanticipated and Difficult Airway- due to anterior larynx Future Recommendations: Recommend- induction with short-acting agent, and alternative techniques readily available Comments: First attempt by CRNA with reported grade 3 view with Mac blade, esophageal intubation immediately recognized with no ETCO2, ETT removed. Mask ventilated before next attempt with Glidescope with SpO2>98% throughout. Successful intubation with Glidescope #4. Atraumatic. Recommend Glidescope for future intubations. Erasmo Downer, MD

## 2022-06-27 NOTE — Telephone Encounter (Signed)
Direct ok, surveillance -- could be with any provider non-ERCPist 1st, ERCPist 2nd (but me preferentially)

## 2022-06-27 NOTE — Telephone Encounter (Signed)
Dr Hilarie Fredrickson,  In reviewing this pt's chart for his upcoming PV noted to have had a difficult intubation in 2020 with a lumbar surgery  See not below and advise OV vs Direct at [facility]

## 2022-06-27 NOTE — Telephone Encounter (Signed)
Error

## 2022-06-30 NOTE — Telephone Encounter (Signed)
See previous phone note.  

## 2022-07-10 ENCOUNTER — Other Ambulatory Visit: Payer: Self-pay | Admitting: *Deleted

## 2022-07-10 DIAGNOSIS — Z86010 Personal history of colonic polyps: Secondary | ICD-10-CM

## 2022-07-10 NOTE — Telephone Encounter (Signed)
I contacted hospital and scheduled patient to have colonoscopy with Dr Candis Schatz on 09/07/22 at 730 am, 600 am arrival. Please make patient aware at his upcoming previsit.

## 2022-07-19 ENCOUNTER — Telehealth: Payer: Self-pay | Admitting: Gastroenterology

## 2022-07-19 NOTE — Telephone Encounter (Signed)
Pts previsit rescheduled to 09/29/22 at 2:30pm, Procedure rescheduled to 10/16/22 at 8:30am. Pt aware of appts.

## 2022-07-19 NOTE — Telephone Encounter (Signed)
PT has a colonoscopy 09/07/22 at [facility] and needs to reschedule. Wants to reschedule the previsit but needs new procedure date first

## 2022-08-14 ENCOUNTER — Encounter: Payer: Managed Care, Other (non HMO) | Admitting: Internal Medicine

## 2022-09-20 ENCOUNTER — Telehealth: Payer: Self-pay | Admitting: *Deleted

## 2022-09-20 NOTE — Telephone Encounter (Signed)
Yessi,   This pt is a documented difficult intubation and his procedure will need to be done at the hospital.    Thanks,  Corena Tilson 

## 2022-09-21 NOTE — Telephone Encounter (Signed)
Scheduled @ Hospital for 10-16-22.

## 2022-09-29 ENCOUNTER — Encounter: Payer: Self-pay | Admitting: Gastroenterology

## 2022-09-29 ENCOUNTER — Ambulatory Visit (AMBULATORY_SURGERY_CENTER): Payer: Self-pay

## 2022-09-29 VITALS — Ht 69.0 in | Wt 230.0 lb

## 2022-09-29 DIAGNOSIS — Z86010 Personal history of colonic polyps: Secondary | ICD-10-CM

## 2022-09-29 MED ORDER — NA SULFATE-K SULFATE-MG SULF 17.5-3.13-1.6 GM/177ML PO SOLN: 1.0000 | Freq: Once | ORAL | 0 refills | Status: AC

## 2022-09-29 NOTE — Progress Notes (Signed)

## 2022-10-05 ENCOUNTER — Encounter (HOSPITAL_COMMUNITY): Payer: Self-pay | Admitting: Gastroenterology

## 2022-10-05 ENCOUNTER — Other Ambulatory Visit: Payer: Self-pay

## 2022-10-15 NOTE — Anesthesia Preprocedure Evaluation (Addendum)
Anesthesia Evaluation  Patient identified by MRN, date of birth, ID band Patient awake    Reviewed: Allergy & Precautions, NPO status , Patient's Chart, lab work & pertinent test results  History of Anesthesia Complications (+) DIFFICULT AIRWAY and history of anesthetic complications  Airway Mallampati: III  TM Distance: >3 FB Neck ROM: Full  Mouth opening: Limited Mouth Opening  Dental no notable dental hx. (+) Teeth Intact   Pulmonary COPD, former smoker   Pulmonary exam normal breath sounds clear to auscultation       Cardiovascular Exercise Tolerance: Good Normal cardiovascular exam Rhythm:Regular Rate:Normal     Neuro/Psych  negative psych ROS   GI/Hepatic   Endo/Other    Renal/GU      Musculoskeletal   Abdominal  (+) + obese (BMI 34)  Peds  Hematology   Anesthesia Other Findings   Reproductive/Obstetrics                             Anesthesia Physical Anesthesia Plan  ASA: 3  Anesthesia Plan: MAC   Post-op Pain Management: Minimal or no pain anticipated   Induction:   PONV Risk Score and Plan: Treatment may vary due to age or medical condition  Airway Management Planned: Natural Airway and Nasal Cannula  Additional Equipment: None  Intra-op Plan:   Post-operative Plan:   Informed Consent: I have reviewed the patients History and Physical, chart, labs and discussed the procedure including the risks, benefits and alternatives for the proposed anesthesia with the patient or authorized representative who has indicated his/her understanding and acceptance.     Dental advisory given  Plan Discussed with:   Anesthesia Plan Comments: (Hx of colon polyps for colonoscopy)        Anesthesia Quick Evaluation

## 2022-10-16 ENCOUNTER — Other Ambulatory Visit: Payer: Self-pay

## 2022-10-16 ENCOUNTER — Ambulatory Visit (HOSPITAL_BASED_OUTPATIENT_CLINIC_OR_DEPARTMENT_OTHER): Payer: Managed Care, Other (non HMO) | Admitting: Physician Assistant

## 2022-10-16 ENCOUNTER — Encounter (HOSPITAL_COMMUNITY): Payer: Self-pay | Admitting: Gastroenterology

## 2022-10-16 ENCOUNTER — Ambulatory Visit (HOSPITAL_COMMUNITY): Payer: Managed Care, Other (non HMO) | Admitting: Physician Assistant

## 2022-10-16 ENCOUNTER — Encounter (HOSPITAL_COMMUNITY)
Admission: RE | Disposition: A | Discharge status: Home / Self Care | Payer: Self-pay | Source: Home / Self Care | Attending: Gastroenterology

## 2022-10-16 ENCOUNTER — Ambulatory Visit (HOSPITAL_COMMUNITY)
Admission: RE | Admit: 2022-10-16 | Discharge: 2022-10-16 | Disposition: A | Discharge status: Home / Self Care | Payer: Managed Care, Other (non HMO) | Attending: Gastroenterology | Admitting: Gastroenterology

## 2022-10-16 DIAGNOSIS — E669 Obesity, unspecified: Secondary | ICD-10-CM | POA: Insufficient documentation

## 2022-10-16 DIAGNOSIS — Z860100 Personal history of colon polyps, unspecified: Secondary | ICD-10-CM

## 2022-10-16 DIAGNOSIS — Z87891 Personal history of nicotine dependence: Secondary | ICD-10-CM | POA: Diagnosis not present

## 2022-10-16 DIAGNOSIS — D123 Benign neoplasm of transverse colon: Secondary | ICD-10-CM

## 2022-10-16 DIAGNOSIS — J449 Chronic obstructive pulmonary disease, unspecified: Secondary | ICD-10-CM | POA: Diagnosis not present

## 2022-10-16 DIAGNOSIS — D124 Benign neoplasm of descending colon: Secondary | ICD-10-CM

## 2022-10-16 DIAGNOSIS — D122 Benign neoplasm of ascending colon: Secondary | ICD-10-CM

## 2022-10-16 DIAGNOSIS — Z6834 Body mass index (BMI) 34.0-34.9, adult: Secondary | ICD-10-CM | POA: Diagnosis not present

## 2022-10-16 DIAGNOSIS — Z1211 Encounter for screening for malignant neoplasm of colon: Secondary | ICD-10-CM | POA: Diagnosis not present

## 2022-10-16 DIAGNOSIS — Z09 Encounter for follow-up examination after completed treatment for conditions other than malignant neoplasm: Secondary | ICD-10-CM | POA: Diagnosis not present

## 2022-10-16 HISTORY — PX: POLYPECTOMY: SHX5525

## 2022-10-16 HISTORY — PX: COLONOSCOPY WITH PROPOFOL: SHX5780

## 2022-10-16 HISTORY — DX: Failed or difficult intubation, initial encounter: T88.4XXA

## 2022-10-16 HISTORY — DX: Chronic obstructive pulmonary disease, unspecified: J44.9

## 2022-10-16 SURGERY — COLONOSCOPY WITH PROPOFOL
Anesthesia: Monitor Anesthesia Care

## 2022-10-16 MED ORDER — LACTATED RINGERS IV SOLN
INTRAVENOUS | Status: DC
  Administered 2022-10-16: 1000 mL via INTRAVENOUS

## 2022-10-16 MED ORDER — LIDOCAINE 2% (20 MG/ML) 5 ML SYRINGE
INTRAMUSCULAR | Status: DC | PRN
  Administered 2022-10-16: 60 mg via INTRAVENOUS

## 2022-10-16 MED ORDER — PROPOFOL 1000 MG/100ML IV EMUL
INTRAVENOUS | Status: AC
  Filled 2022-10-16: qty 100

## 2022-10-16 MED ORDER — PROPOFOL 500 MG/50ML IV EMUL
INTRAVENOUS | Status: DC | PRN
  Administered 2022-10-16: 100 ug/kg/min via INTRAVENOUS

## 2022-10-16 MED ORDER — PROPOFOL 10 MG/ML IV BOLUS
INTRAVENOUS | Status: DC | PRN
  Administered 2022-10-16 (×2): 30 mg via INTRAVENOUS

## 2022-10-16 MED ORDER — PHENYLEPHRINE 80 MCG/ML (10ML) SYRINGE FOR IV PUSH (FOR BLOOD PRESSURE SUPPORT)
PREFILLED_SYRINGE | INTRAVENOUS | Status: DC | PRN
  Administered 2022-10-16: 120 ug via INTRAVENOUS
  Administered 2022-10-16 (×3): 80 ug via INTRAVENOUS
  Administered 2022-10-16: 160 ug via INTRAVENOUS

## 2022-10-16 MED ORDER — SODIUM CHLORIDE 0.9 % IV SOLN: INTRAVENOUS | Status: DC

## 2022-10-16 SURGICAL SUPPLY — 22 items

## 2022-10-16 NOTE — Anesthesia Postprocedure Evaluation (Signed)
Anesthesia Post Note  Patient: Tim West  Procedure(s) Performed: COLONOSCOPY WITH PROPOFOL POLYPECTOMY     Patient location during evaluation: Endoscopy Anesthesia Type: MAC Level of consciousness: awake and alert Pain management: pain level controlled Vital Signs Assessment: post-procedure vital signs reviewed and stable Respiratory status: spontaneous breathing, nonlabored ventilation, respiratory function stable and patient connected to nasal cannula oxygen Cardiovascular status: blood pressure returned to baseline and stable Postop Assessment: no apparent nausea or vomiting Anesthetic complications: no  No notable events documented.  Last Vitals:  Vitals:   10/16/22 0954 10/16/22 1002  BP: (!) 114/43 103/61  Pulse: (!) 54 67  Resp: 11 17  Temp: (!) 36.2 C   SpO2: 99% 98%    Last Pain:  Vitals:   10/16/22 1002  TempSrc:   PainSc: 0-No pain                 Barnet Glasgow

## 2022-10-16 NOTE — Discharge Instructions (Signed)
YOU HAD AN ENDOSCOPIC PROCEDURE TODAY: Refer to the procedure report and other information in the discharge instructions given to you for any specific questions about what was found during the examination. If this information does not answer your questions, please call [place] office at [phone] to clarify.  ° °YOU SHOULD EXPECT: Some feelings of bloating in the abdomen. Passage of more gas than usual. Walking can help get rid of the air that was put into your GI tract during the procedure and reduce the bloating. If you had a lower endoscopy (such as a colonoscopy or flexible sigmoidoscopy) you may notice spotting of blood in your stool or on the toilet paper. Some abdominal soreness may be present for a day or two, also. ° °DIET: Your first meal following the procedure should be a light meal and then it is ok to progress to your normal diet. A half-sandwich or bowl of soup is an example of a good first meal. Heavy or fried foods are harder to digest and may make you feel nauseous or bloated. Drink plenty of fluids but you should avoid alcoholic beverages for 24 hours. If you had a esophageal dilation, please see attached instructions for diet.   ° °ACTIVITY: Your care partner should take you home directly after the procedure. You should plan to take it easy, moving slowly for the rest of the day. You can resume normal activity the day after the procedure however YOU SHOULD NOT DRIVE, use power tools, machinery or perform tasks that involve climbing or major physical exertion for 24 hours (because of the sedation medicines used during the test).  ° °SYMPTOMS TO REPORT IMMEDIATELY: °A gastroenterologist can be reached at any hour. Please call [phone]  for any of the following symptoms:  °Following lower endoscopy (colonoscopy, flexible sigmoidoscopy) °Excessive amounts of blood in the stool  °Significant tenderness, worsening of abdominal pains  °Swelling of the abdomen that is new, acute  °Fever of 100° or  higher  °Following upper endoscopy (EGD, EUS, ERCP, esophageal dilation) °Vomiting of blood or coffee ground material  °New, significant abdominal pain  °New, significant chest pain or pain under the shoulder blades  °Painful or persistently difficult swallowing  °New shortness of breath  °Black, tarry-looking or red, bloody stools ° °FOLLOW UP:  °If any biopsies were taken you will be contacted by phone or by letter within the next 1-3 weeks. Call [phone]  if you have not heard about the biopsies in 3 weeks.  °Please also call with any specific questions about appointments or follow up tests. ° °

## 2022-10-16 NOTE — H&P (Signed)
[facility] History and Physical   Primary Care Physician:  Carlisle Beers, MD   Reason for Procedure:   History of colon polyps  Plan:    Surveillance colonoscopy     HPI: Tim West is a 66 y.o. male undergoing surveillance colonoscopy.  His last colonoscopy was in 2017 and 4 small tubular adenomas were removed.  He has no family history of colon cancer and no chronic GI symptoms.  Procedure performed in hospital due to history of difficult intubation.   Past Medical History:  Diagnosis Date   Adenomatous polyps    COPD (chronic obstructive pulmonary disease) ([place])    no treatment   Difficult airway for intubation 2020   Difficult intubation    pt unaware of this  no one told him about this   Pneumonia     Past Surgical History:  Procedure Laterality Date   APPENDECTOMY     COLONOSCOPY  2010   records here   LUMBAR LAMINECTOMY/DECOMPRESSION MICRODISCECTOMY Left 07/03/2019   Procedure: Left Lumbar four-five disectomy;  Surgeon: Melina Schools, MD;  Location: [facility];  Service: Orthopedics;  Laterality: Left;   POLYPECTOMY     TONSILLECTOMY     VASECTOMY      Prior to Admission medications   Medication Sig Start Date End Date Taking? Authorizing Provider  aspirin-acetaminophen-caffeine (BAYER MIGRAINE) [phone] MG tablet Take 2 tablets by mouth every 6 (six) hours as needed for headache.   Yes [provider]    Current Facility-Administered Medications  Medication Dose Route Frequency Provider Last Rate Last Admin   0.9 %  sodium chloride infusion   Intravenous Continuous Pyrtle, Lajuan Lines, MD       lactated ringers infusion   Intravenous Continuous Daryel November, MD 10 mL/hr at 10/16/22 0744 Continued from Pre-op at 10/16/22 0744    Allergies as of 07/10/2022   (No Known Allergies)    Family History  Problem Relation Age of Onset   Diabetes Mother    High blood pressure Father    Prostate cancer Father    Colon polyps Sister     Colon cancer Neg Hx    Esophageal cancer Neg Hx    Rectal cancer Neg Hx    Stomach cancer Neg Hx     Social History   Socioeconomic History   Marital status: Married    Spouse name: Not on file   Number of children: Not on file   Years of education: Not on file   Highest education level: Not on file  Occupational History   Not on file  Tobacco Use   Smoking status: Former    Types: Cigarettes    Quit date: 11/27/2017    Years since quitting: 4.8   Smokeless tobacco: Never   Tobacco comments:    occasional [place] use  Vaping Use   Vaping Use: Former  Substance and Sexual Activity   Alcohol use: Yes    Alcohol/week: 3.0 standard drinks of alcohol    Types: 3 Glasses of wine per week    Comment: occasional weekends   Drug use: No   Sexual activity: Not Currently  Other Topics Concern   Not on file  Social History Narrative   Not on file   Social Determinants of Health   Financial Resource Strain: Not on file  Food Insecurity: Not on file  Transportation Needs: Not on file  Physical Activity: Not on file  Stress: Not on file  Social Connections: Not on file  Intimate Partner Violence: Not on file    Review of Systems:  All other review of systems negative except as mentioned in the HPI.  Physical Exam: Vital signs BP (!) 164/74   Pulse 68   Temp (!) 97.2 F (36.2 C) (Tympanic)   Resp 12   Ht '5\' 9"'$  (1.753 m)   Wt 104.3 kg   SpO2 100%   BMI 33.96 kg/m   General:   Alert,  Well-developed, well-nourished, pleasant and cooperative in NAD Airway:  Mallampati 2 Lungs:  Clear throughout to auscultation.   Heart:  Regular rate and rhythm; no murmurs, clicks, rubs,  or gallops. Abdomen:  Soft, nontender and nondistended. Normal bowel sounds.   Neuro/Psych:  Normal mood and affect. A and O x 3   Garald Rhew E. Candis Schatz, MD [facility]

## 2022-10-16 NOTE — Anesthesia Procedure Notes (Signed)
Procedure Name: MAC Date/Time: 10/16/2022 9:00 AM  Performed by: Lollie Sails, CRNAPre-anesthesia Checklist: Patient identified, Emergency Drugs available, Suction available, Patient being monitored and Timeout performed Oxygen Delivery Method: Simple face mask Placement Confirmation: positive ETCO2

## 2022-10-16 NOTE — Op Note (Signed)
[facility] Patient Name: Tim West Procedure Date: 10/16/2022 MRN: [id number] Attending MD: Gladstone Pih. Candis Schatz , MD, [id number] Date of Birth: 09/19/1956 CSN: [id number] Age: 66 Admit Type: Outpatient Procedure:                Colonoscopy Indications:              High risk colon cancer surveillance: Personal                            history of adenoma (10 mm or greater in size), High                            risk colon cancer surveillance: Personal history of                            multiple (3 or more) adenomas; last colonoscopy                            2019, 4 small tubular adenomas removed Providers:                Nicki Reaper E. Candis Schatz, MD, Dulcy Fanny, Brien Mates, Technician Referring MD:              Medicines:                Monitored Anesthesia Care Complications:            No immediate complications. Estimated Blood Loss:     Estimated blood loss was minimal. Procedure:                Pre-Anesthesia Assessment:                           - Prior to the procedure, a History and Physical                            was performed, and patient medications and                            allergies were reviewed. The patient's tolerance of                            previous anesthesia was also reviewed. The risks                            and benefits of the procedure and the sedation                            options and risks were discussed with the patient.                            All questions were answered, and informed consent  was obtained. Prior Anticoagulants: The patient has                            taken no anticoagulant or antiplatelet agents. ASA                            Grade Assessment: II - A patient with mild systemic                            disease. After reviewing the risks and benefits,                            the patient was deemed in satisfactory  condition to                            undergo the procedure.                           After obtaining informed consent, the colonoscope                            was passed under direct vision. Throughout the                            procedure, the patient's blood pressure, pulse, and                            oxygen saturations were monitored continuously. The                            CF-HQ190L ([id number]) Olympus colonoscope was                            introduced through the anus and advanced to the the                            cecum, identified by appendiceal orifice and                            ileocecal valve. The colonoscopy was technically                            difficult and complex due to significant looping.                            Successful completion of the procedure was aided by                            changing the patient to a prone position, using                            manual pressure and withdrawing and reinserting the  scope. The patient tolerated the procedure well.                            The quality of the bowel preparation was adequate.                            The ileocecal valve, appendiceal orifice, and                            rectum were photographed. Scope In: 9:06:10 AM Scope Out: 9:47:07 AM Scope Withdrawal Time: 0 hours 27 minutes 11 seconds  Total Procedure Duration: 0 hours 40 minutes 57 seconds  Findings:      The perianal and digital rectal examinations were normal. Pertinent       negatives include normal sphincter tone, no palpable rectal lesions and       normal prostate (size, shape, and consistency).      Two sessile polyps were found in the ascending colon. The polyps were 2       to 8 mm in size. These polyps were removed with a cold snare. Resection       and retrieval were complete. Estimated blood loss was minimal.      A 2 mm polyp was found in the ascending colon. The polyp was  sessile.       The polyp was removed with a jumbo cold forceps. Resection and retrieval       were complete. Estimated blood loss was minimal.      A 4 mm polyp was found in the transverse colon. The polyp was sessile.       The polyp was removed with a cold snare. Resection and retrieval were       complete. Estimated blood loss was minimal.      A 4 mm polyp was found in the descending colon. The polyp was sessile.       The polyp was removed with a cold snare. Resection and retrieval were       complete. Estimated blood loss was minimal.      The exam was otherwise normal throughout the examined colon.      The retroflexed view of the distal rectum and anal verge was normal and       showed no anal or rectal abnormalities. Impression:               - Two 2 to 8 mm polyps in the ascending colon,                            removed with a cold snare. Resected and retrieved.                           - One 2 mm polyp in the ascending colon, removed                            with a jumbo cold forceps. Resected and retrieved.                           - One 4 mm polyp in the transverse colon, removed  with a cold snare. Resected and retrieved.                           - One 4 mm polyp in the descending colon, removed                            with a cold snare. Resected and retrieved.                           - The distal rectum and anal verge are normal on                            retroflexion view. Moderate Sedation:      Not Applicable - Patient had care per Anesthesia. Recommendation:           - Patient has a contact number available for                            emergencies. The signs and symptoms of potential                            delayed complications were discussed with the                            patient. Return to normal activities tomorrow.                            Written discharge instructions were provided to the                             patient.                           - Resume previous diet.                           - Continue present medications.                           - Await pathology results.                           - Repeat colonoscopy (date not yet determined) for                            surveillance based on pathology results. Procedure Code(s):        --- Professional ---                           [phone], Colonoscopy, flexible; with removal of                            tumor(s), polyp(s), or other lesion(s) by snare  technique                           [id number], 59, Colonoscopy, flexible; with biopsy,                            single or multiple Diagnosis Code(s):        --- Professional ---                           D12.2, Benign neoplasm of ascending colon                           D12.3, Benign neoplasm of transverse colon (hepatic                            flexure or splenic flexure)                           D12.4, Benign neoplasm of descending colon                           Z86.010, Personal history of colonic polyps CPT copyright 2022 [employer]. All rights reserved. The codes documented in this report are preliminary and upon coder review may  be revised to meet current compliance requirements. Hamlet Lasecki E. Candis Schatz, MD 10/16/2022 9:53:51 AM This report has been signed electronically. Number of Addenda: 0

## 2022-10-16 NOTE — Transfer of Care (Signed)
Immediate Anesthesia Transfer of Care Note  Patient: Tim West  Procedure(s) Performed: Procedure(s) with comments: COLONOSCOPY WITH PROPOFOL (N/A) - difficult intubation 2020 POLYPECTOMY  Patient Location: PACU  Anesthesia Type:MAC  Level of Consciousness: Patient easily awoken, sedated, comfortable, cooperative, following commands, responds to stimulation.   Airway & Oxygen Therapy: Patient spontaneously breathing, ventilating well, oxygen via simple oxygen mask.  Post-op Assessment: Report given to PACU RN, vital signs reviewed and stable, moving all extremities.   Post vital signs: Reviewed and stable.  Complications: No apparent anesthesia complications Last Vitals:  Vitals Value Taken Time  BP 114/43 10/16/22 0952  Temp    Pulse 54 10/16/22 0953  Resp 11 10/16/22 0953  SpO2 99 % 10/16/22 0954  Vitals shown include unvalidated device data.  Last Pain:  Vitals:   10/16/22 0724  TempSrc: Tympanic  PainSc: 0-No pain         Complications: No notable events documented.

## 2022-10-17 LAB — SURGICAL PATHOLOGY

## 2022-10-20 ENCOUNTER — Encounter (HOSPITAL_COMMUNITY): Payer: Self-pay | Admitting: Gastroenterology

## 2022-10-21 NOTE — Progress Notes (Signed)
Tim West,   The five polyps that I removed during your recent procedure were completely benign but were proven to be "pre-cancerous" polyps that MAY have grown into cancers if they had not been removed.  Studies shows that at least 20% of women over age 66 and 30% of men over age 28 have pre-cancerous polyps.  Based on current nationally recognized surveillance guidelines, I recommend that you have a repeat colonoscopy in 3 years.   Again, due to your history of a difficult intubation, this will need to be done in the hospital setting.  If you develop any new rectal bleeding, abdominal pain or significant bowel habit changes, please contact me before then.

## 2023-07-02 ENCOUNTER — Telehealth: Payer: Self-pay | Admitting: Gastroenterology

## 2023-07-02 NOTE — Telephone Encounter (Signed)
PT needs a script for Linzess. His chart says he stopped taking this medication 09/2022. It should be sent to [facility]

## 2023-07-03 ENCOUNTER — Other Ambulatory Visit: Payer: Self-pay

## 2023-07-03 MED ORDER — LINACLOTIDE 145 MCG PO CAPS: 145.0000 ug | ORAL_CAPSULE | Freq: Every day | ORAL | 3 refills | Status: AC

## 2023-07-03 NOTE — Telephone Encounter (Signed)
Okay to renew Linzess 145 mcg daily
